# Patient Record
Sex: Male | Born: 1960 | Race: White | Hispanic: No | Marital: Single | Smoking: Former smoker
Health system: Southern US, Community
[De-identification: ages and names within clinical notes are randomized; demographics above are authoritative.]

## PROBLEM LIST (undated history)

## (undated) DIAGNOSIS — T7840XA Allergy, unspecified, initial encounter: Secondary | ICD-10-CM

## (undated) DIAGNOSIS — Z8669 Personal history of other diseases of the nervous system and sense organs: Secondary | ICD-10-CM

## (undated) DIAGNOSIS — R51 Headache: Secondary | ICD-10-CM

## (undated) DIAGNOSIS — R569 Unspecified convulsions: Secondary | ICD-10-CM

## (undated) DIAGNOSIS — I10 Essential (primary) hypertension: Secondary | ICD-10-CM

## (undated) DIAGNOSIS — H5347 Heteronymous bilateral field defects: Secondary | ICD-10-CM

## (undated) DIAGNOSIS — M1990 Unspecified osteoarthritis, unspecified site: Secondary | ICD-10-CM

## (undated) DIAGNOSIS — Q899 Congenital malformation, unspecified: Secondary | ICD-10-CM

## (undated) DIAGNOSIS — I639 Cerebral infarction, unspecified: Secondary | ICD-10-CM

## (undated) DIAGNOSIS — M5412 Radiculopathy, cervical region: Secondary | ICD-10-CM

## (undated) DIAGNOSIS — D649 Anemia, unspecified: Secondary | ICD-10-CM

## (undated) DIAGNOSIS — I671 Cerebral aneurysm, nonruptured: Secondary | ICD-10-CM

## (undated) DIAGNOSIS — H269 Unspecified cataract: Secondary | ICD-10-CM

## (undated) HISTORY — DX: Headache: R51

## (undated) HISTORY — DX: Unspecified cataract: H26.9

## (undated) HISTORY — DX: Allergy, unspecified, initial encounter: T78.40XA

## (undated) HISTORY — DX: Anemia, unspecified: D64.9

## (undated) HISTORY — DX: Heteronymous bilateral field defects: H53.47

## (undated) HISTORY — PX: BRAIN SURGERY: SHX531

## (undated) HISTORY — PX: NECK SURGERY: SHX720

## (undated) HISTORY — DX: Essential (primary) hypertension: I10

## (undated) HISTORY — DX: Personal history of other diseases of the nervous system and sense organs: Z86.69

## (undated) HISTORY — DX: Unspecified osteoarthritis, unspecified site: M19.90

## (undated) HISTORY — DX: Radiculopathy, cervical region: M54.12

## (undated) HISTORY — DX: Congenital malformation, unspecified: Q89.9

## (undated) HISTORY — DX: Cerebral infarction, unspecified: I63.9

---

## 1987-10-13 HISTORY — PX: OTHER SURGICAL HISTORY: SHX169

## 1988-03-12 HISTORY — PX: CERVICAL DISCECTOMY: SHX98

## 1995-10-13 HISTORY — PX: OTHER SURGICAL HISTORY: SHX169

## 1995-11-13 HISTORY — PX: OTHER SURGICAL HISTORY: SHX169

## 1998-11-27 ENCOUNTER — Encounter: Payer: Self-pay | Admitting: Neurosurgery

## 1998-11-29 ENCOUNTER — Inpatient Hospital Stay (HOSPITAL_COMMUNITY): Admission: RE | Admit: 1998-11-29 | Discharge: 1998-11-30 | Payer: Self-pay | Admitting: Neurosurgery

## 1999-10-01 ENCOUNTER — Encounter: Payer: Self-pay | Admitting: *Deleted

## 1999-10-01 ENCOUNTER — Encounter: Admission: RE | Admit: 1999-10-01 | Discharge: 1999-10-01 | Payer: Self-pay | Admitting: *Deleted

## 1999-10-03 ENCOUNTER — Encounter: Payer: Self-pay | Admitting: Neurological Surgery

## 1999-10-03 ENCOUNTER — Ambulatory Visit (HOSPITAL_COMMUNITY): Admission: RE | Admit: 1999-10-03 | Discharge: 1999-10-03 | Payer: Self-pay | Admitting: Neurological Surgery

## 1999-10-04 ENCOUNTER — Encounter: Payer: Self-pay | Admitting: Neurological Surgery

## 1999-10-04 ENCOUNTER — Observation Stay (HOSPITAL_COMMUNITY): Admission: RE | Admit: 1999-10-04 | Discharge: 1999-10-04 | Payer: Self-pay | Admitting: Neurological Surgery

## 1999-10-15 ENCOUNTER — Encounter: Admission: RE | Admit: 1999-10-15 | Discharge: 1999-10-15 | Payer: Self-pay | Admitting: Neurological Surgery

## 1999-10-15 ENCOUNTER — Encounter: Payer: Self-pay | Admitting: Neurological Surgery

## 1999-10-31 ENCOUNTER — Encounter: Payer: Self-pay | Admitting: Neurological Surgery

## 1999-10-31 ENCOUNTER — Encounter: Admission: RE | Admit: 1999-10-31 | Discharge: 1999-10-31 | Payer: Self-pay | Admitting: Neurological Surgery

## 1999-12-10 ENCOUNTER — Encounter: Payer: Self-pay | Admitting: Neurological Surgery

## 1999-12-10 ENCOUNTER — Encounter: Admission: RE | Admit: 1999-12-10 | Discharge: 1999-12-10 | Payer: Self-pay | Admitting: Neurological Surgery

## 2000-07-10 ENCOUNTER — Emergency Department (HOSPITAL_COMMUNITY): Admission: EM | Admit: 2000-07-10 | Discharge: 2000-07-10 | Payer: Self-pay

## 2001-03-08 ENCOUNTER — Encounter: Admission: RE | Admit: 2001-03-08 | Discharge: 2001-03-08 | Payer: Self-pay | Admitting: Neurosurgery

## 2001-03-08 ENCOUNTER — Encounter: Payer: Self-pay | Admitting: Neurosurgery

## 2002-02-21 ENCOUNTER — Encounter: Payer: Self-pay | Admitting: Neurosurgery

## 2002-02-21 ENCOUNTER — Ambulatory Visit (HOSPITAL_COMMUNITY): Admission: RE | Admit: 2002-02-21 | Discharge: 2002-02-21 | Payer: Self-pay | Admitting: Neurosurgery

## 2002-04-04 ENCOUNTER — Inpatient Hospital Stay (HOSPITAL_COMMUNITY): Admission: RE | Admit: 2002-04-04 | Discharge: 2002-04-05 | Payer: Self-pay | Admitting: Neurosurgery

## 2002-04-04 ENCOUNTER — Encounter: Payer: Self-pay | Admitting: Neurosurgery

## 2004-11-12 ENCOUNTER — Ambulatory Visit (HOSPITAL_COMMUNITY): Admission: RE | Admit: 2004-11-12 | Discharge: 2004-11-12 | Payer: Self-pay | Admitting: Family Medicine

## 2005-02-25 ENCOUNTER — Ambulatory Visit (HOSPITAL_COMMUNITY): Admission: RE | Admit: 2005-02-25 | Discharge: 2005-02-25 | Payer: Self-pay | Admitting: Neurology

## 2005-04-08 ENCOUNTER — Encounter: Admission: RE | Admit: 2005-04-08 | Discharge: 2005-04-08 | Payer: Self-pay | Admitting: Neurology

## 2005-05-28 ENCOUNTER — Ambulatory Visit (HOSPITAL_COMMUNITY): Admission: RE | Admit: 2005-05-28 | Discharge: 2005-05-28 | Payer: Self-pay | Admitting: Neurosurgery

## 2006-01-31 IMAGING — CT CT HEAD W/O CM
1 of 2 series · 13 of 30 positions shown, 17 images · non-contrast
Comparison: 03/08/01.

CLINICAL DATA: Sharp pain in head, AVM repair 1887, aneurysm clip. 
 CT HEAD WITHOUT CONTRAST ([DATE] HOURS):

[Series 2: brain · axial · 0.47mm/px · z∈[+158,+279]mm · 13 of 28 slices shown, 17 images]
[im 2/28  brain]
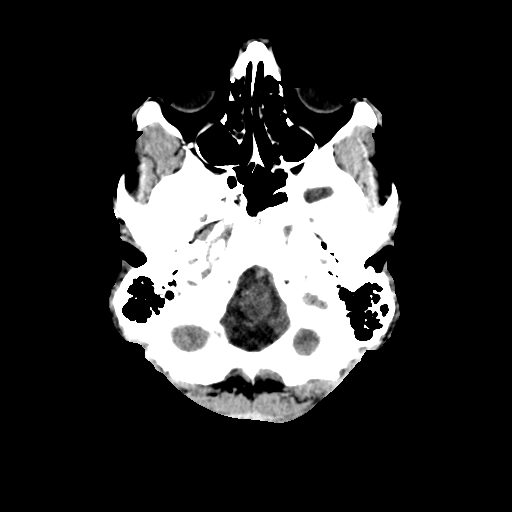
[im 2/28  bone]
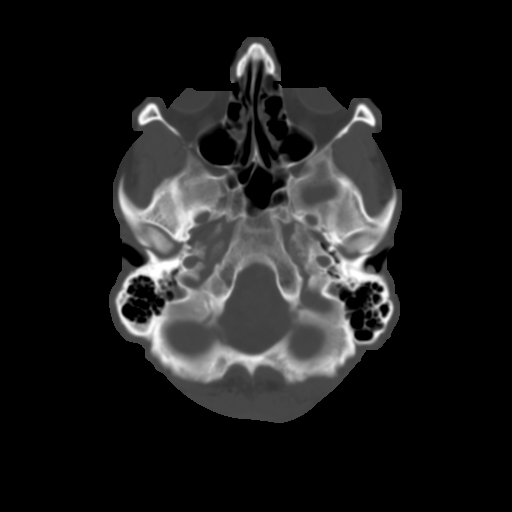
[im 4/28  brain]
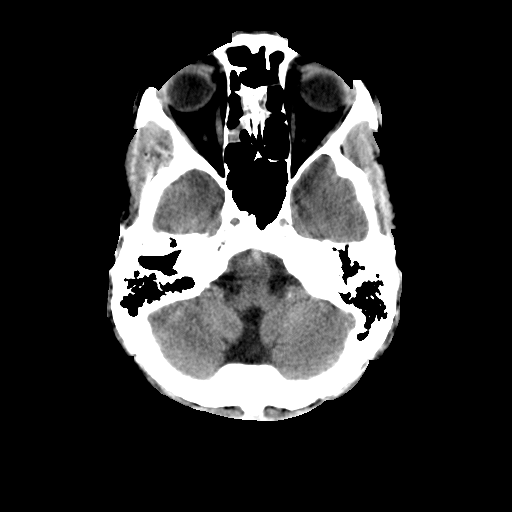
[im 6/28  brain]
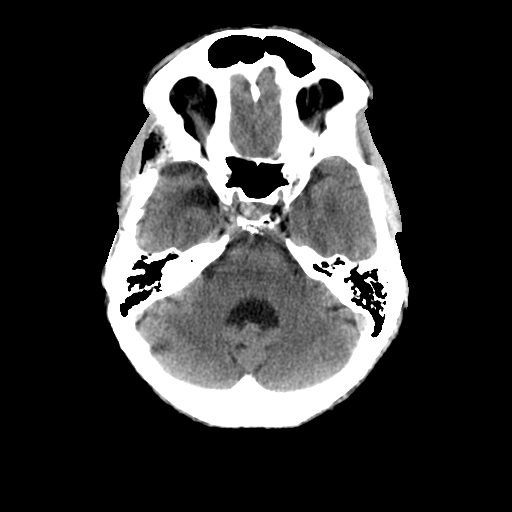
[im 8/28  brain]
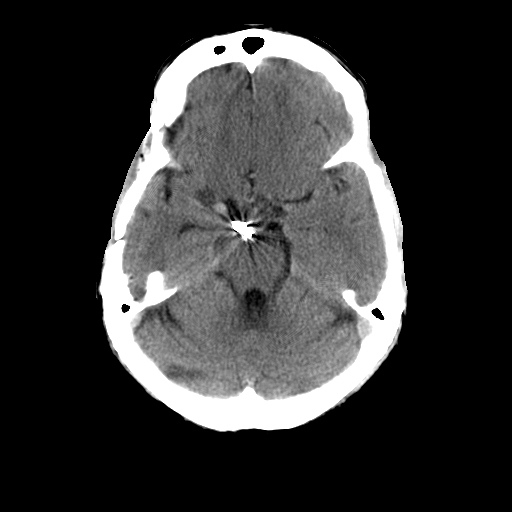
[im 10/28  brain]
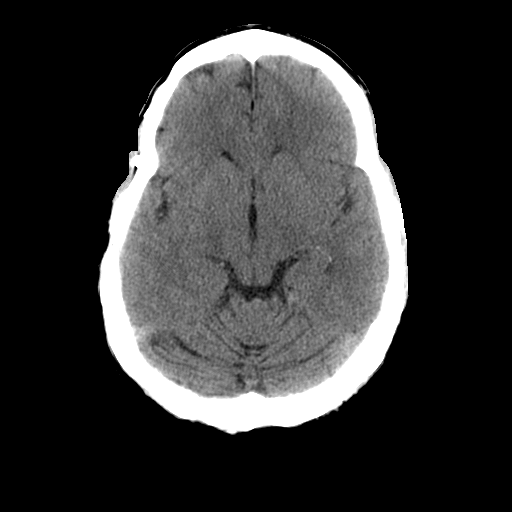
[im 10/28  bone]
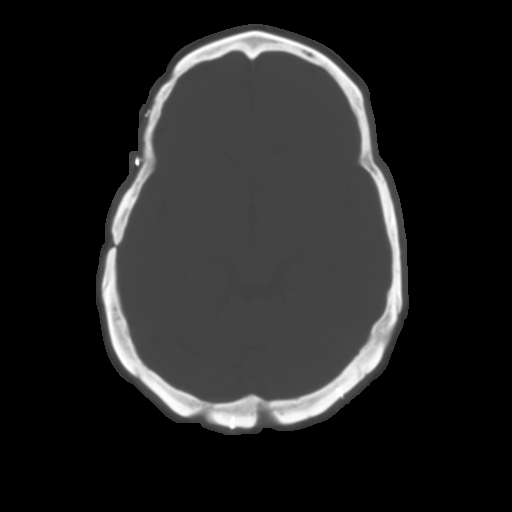
[im 12/28  brain]
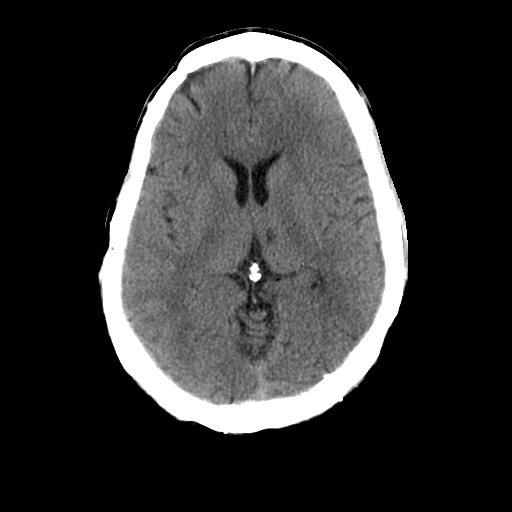
[im 14/28  brain]
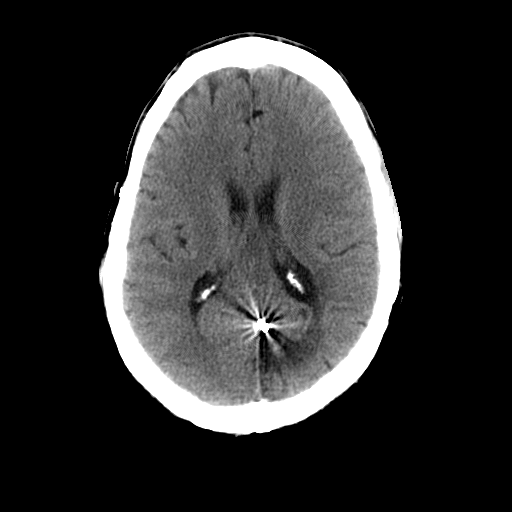
[im 16/28  brain]
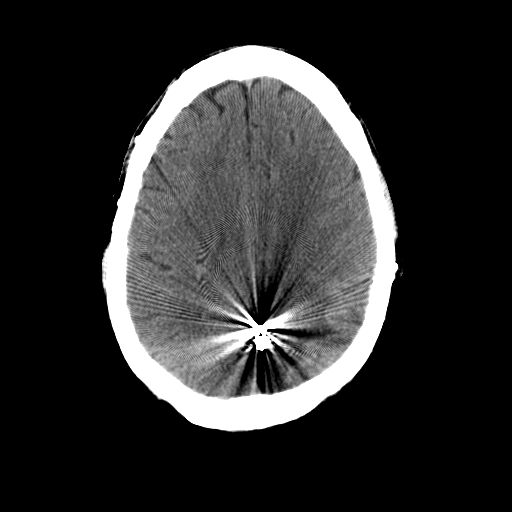
[im 18/28  brain]
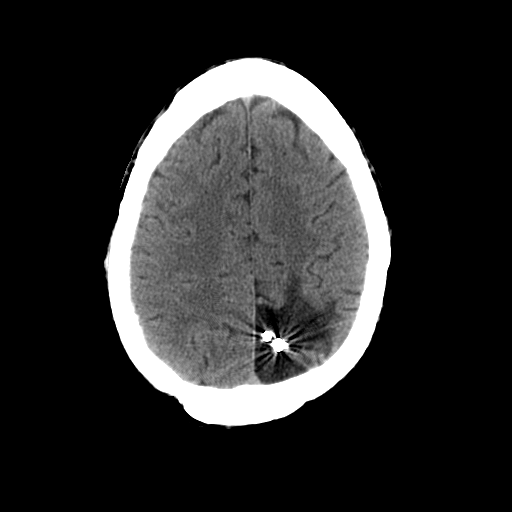
[im 18/28  bone]
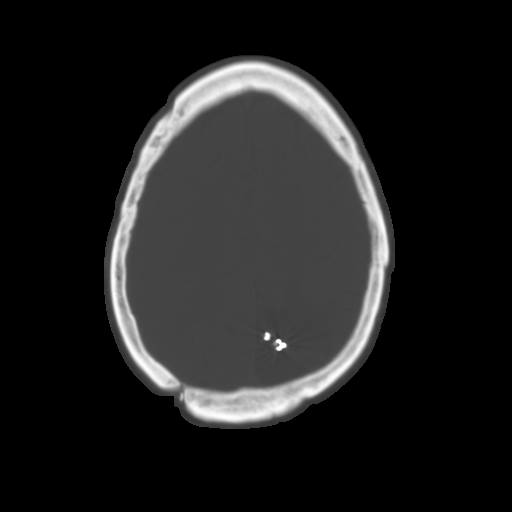
[im 20/28  brain]
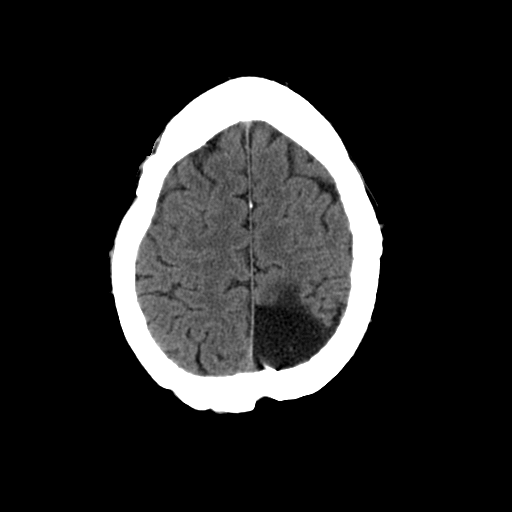
[im 22/28  brain]
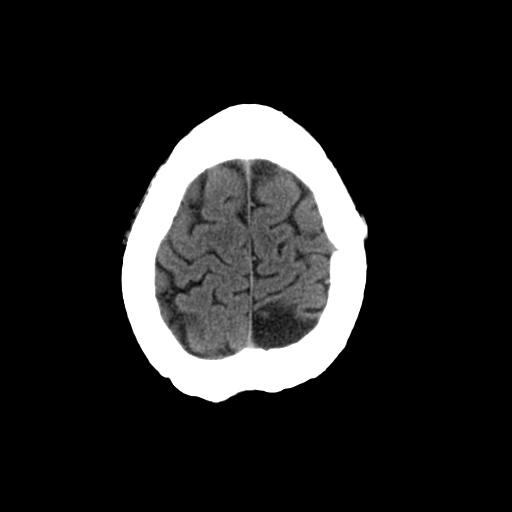
[im 24/28  brain]
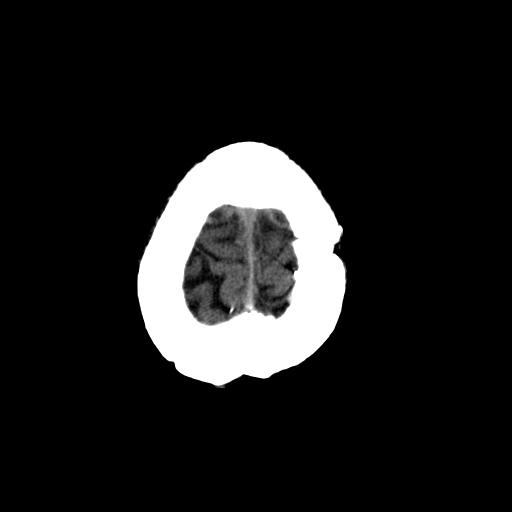
[im 26/28  brain]
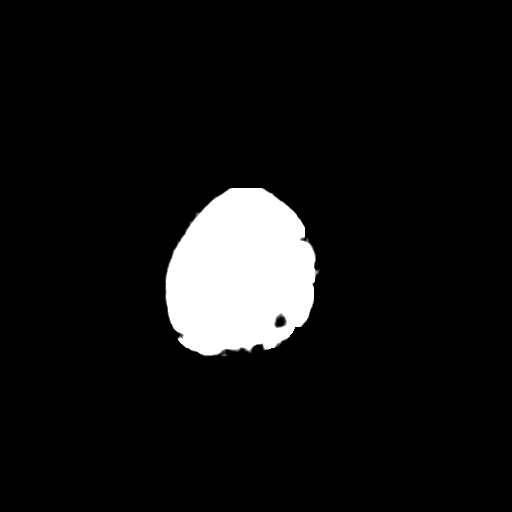
[im 26/28  bone]
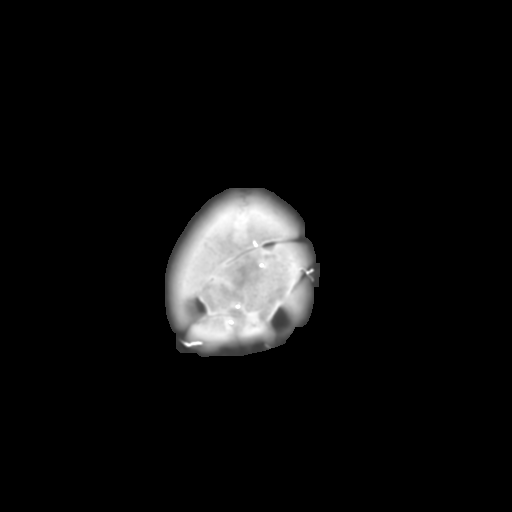

[13 of 30 positions shown; findings below may reference images not displayed]

FINDINGS: An aneurysm clip adjacent to the right posterior clinoid is unchanged in position.  Vascular coils are present in the left parietal region and not significantly changed.  Craniotomy defects are present in the right frontotemporal region and at the vertex.  Encephalomalacia in the left parietooccipital region is again present without change.  Focal encephalomalacia in the left thalamus is stable.  There is no evidence of mass effect, midline shift, or acute hemorrhage.  Fluid within the right ethmoid air cells is noted.
IMPRESSION: No acute intracranial pathology.  Postsurgical changes.

## 2010-01-10 ENCOUNTER — Encounter: Admission: RE | Admit: 2010-01-10 | Discharge: 2010-01-10 | Payer: Self-pay | Admitting: Neurology

## 2010-11-02 ENCOUNTER — Encounter: Payer: Self-pay | Admitting: Neurology

## 2011-02-27 NOTE — H&P (Signed)
[facility]  Patient:    Eric Craig, Eric Craig Visit Number: [id number] MRN: [id number]          Service Type: SUR Location: 3000 [id number] Attending Physician:  Danella Penton Dictated by:   Tanya Nones. Jeral Fruit, M.D. Admit Date:  04/04/2002                           History and Physical  HISTORY OF PRESENT ILLNESS: The patient is a gentleman who is being admitted because of hypertrophy of the left upper extremity.  This gentleman underwent several cervical procedures as well as brain surgery.  He was seen in my office a month ago complaining of some weakness of the left upper extremity. Because of the amount of weakness he had decided to have a cervical myelogram. The reason was because he has several clips, including one in the basilar artery.  He denies any problem with the right upper extremity.  PAST MEDICAL HISTORY:  1. 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  2. Also surgery for anterior cervical diskectomy at the level of 4-5 and     also 5-6.  MEDICATIONS: Dilantin for some time.  FAMILY HISTORY: Unremarkable.  PHYSICAL EXAMINATION:  HEENT: There is scar from previous surgery.  NECK: Anterior and posterior scar.  He is able to flex and extend with no problem.  LUNGS: Clear.  CARDIAC: Heart sounds normal.  ABDOMEN: Normal.  EXTREMITIES: Normal pulses.  NEUROLOGIC: Mental status normal.  Cranial nerves normal.  Strength 5/5 except in the left upper extremity.  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  He also has hypotonia of the left pectoralis major. Reflexes absent in the left biceps and triceps on the left side.  Sensation normal.  Coordination normal.  LABORATORY DATA: Cervical myelogram showed that he has a large spur at the level of 5-6 compromising the C6 nerve root; 4-5  and 5-6 and 6-7 are completely normal.  CLINICAL IMPRESSION: Left C6 radiculopathy, chronic, secondary to osteophyte.  PLAN: The patient is being admitted for surgery ______.  He knows about the risks such as infection, CSF leak, worsening of pain, paralysis, need for further surgery, no improvement whatsoever, or emboli. Dictated by:   Tanya Nones. Jeral Fruit, M.D. Attending Physician:  Danella Penton DD:  04/04/02 TD:  04/05/02 Job: [phone]

## 2011-02-27 NOTE — Op Note (Signed)
[facility]  Patient:    Eric Craig, Eric Craig Visit Number: [id number] MRN: [id number]          Service Type: SUR Location: 3000 [id number] Attending Physician:  Danella Penton Dictated by:   Tanya Nones. Jeral Fruit, M.D. Proc. 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  OPERATION PERFORMED:  Left C5-6 foraminotomy using the C-arm of the Matrix system.  SURGEON:  Tanya Nones. Jeral Fruit, M.D.  ANESTHESIA:  INDICATIONS FOR PROCEDURE:  The patient is a 50 year old gentleman who underwent multiple brain surgery as well as spinal surgery.  I saw him in my office several weeks ago with atrophy of the left upper extremity.  He had weakness of the biceps and the left wrist extensor and the triceps.  Myelogram showed that he has stenosis at L5-6 secondary to a large spondylosis.  Surgery was advised.  DESCRIPTION OF PROCEDURE:  The patient was taken to the OR and he was positioned up in a sitting manner using three pins.  Then the neck was prepped with Betadine.  Then using the C-arm with a pin, we were able to localize the area between 5-6 and the left side.  An incision less than an inch was made on the midline through the skin down to the muscle and to the fascia.  Then dilator were inserted.  This was done using the C-arm.  At the end we were able to localize the area of 5-6.  The final dilator was positioned in place and tied with the retractor system.  Then we brought the microscope.  The muscle was retracted and it was cut with the Bovie.  We identified 5-6.   With the drill, we removed the lower lamina of C5 and the upper of C6.  Because we knew that it was fusioned _______, we went laterally and removed the facet. We found that the nerve was full of scar and was tight and quite red.  There was an area that was yellowish.  Using the 1 and 2 mm Kerrison punch, we were able to accomplish complete foraminotomy with plenty of room for the C6 nerve root. Final opinion, the ____________ bone below was negative and there was no evidence of any soft disk.  Having done this the area was irrigated. The wound was closed with Vicryl and Steri-Strip.  The patient will go back again to his private room. Dictated by:   Tanya Nones. Jeral Fruit, M.D. Attending Physician:  Danella Penton DD:  04/04/02 TD:  04/05/02 Job: [id number]

## 2011-10-13 DIAGNOSIS — I639 Cerebral infarction, unspecified: Secondary | ICD-10-CM

## 2011-10-13 HISTORY — DX: Cerebral infarction, unspecified: I63.9

## 2011-11-13 ENCOUNTER — Emergency Department (HOSPITAL_COMMUNITY): Payer: 59

## 2011-11-13 ENCOUNTER — Inpatient Hospital Stay (HOSPITAL_COMMUNITY)
Admission: EM | Admit: 2011-11-13 | Discharge: 2011-11-16 | DRG: 066 | Disposition: A | Discharge status: Home Health | Payer: 59 | Attending: Internal Medicine | Admitting: Internal Medicine

## 2011-11-13 ENCOUNTER — Other Ambulatory Visit: Payer: Self-pay

## 2011-11-13 ENCOUNTER — Encounter (HOSPITAL_COMMUNITY): Payer: Self-pay | Admitting: Emergency Medicine

## 2011-11-13 DIAGNOSIS — G40909 Epilepsy, unspecified, not intractable, without status epilepticus: Secondary | ICD-10-CM | POA: Diagnosis present

## 2011-11-13 DIAGNOSIS — Z8673 Personal history of transient ischemic attack (TIA), and cerebral infarction without residual deficits: Secondary | ICD-10-CM | POA: Diagnosis present

## 2011-11-13 DIAGNOSIS — I6350 Cerebral infarction due to unspecified occlusion or stenosis of unspecified cerebral artery: Principal | ICD-10-CM | POA: Diagnosis present

## 2011-11-13 DIAGNOSIS — I10 Essential (primary) hypertension: Secondary | ICD-10-CM | POA: Diagnosis present

## 2011-11-13 DIAGNOSIS — I639 Cerebral infarction, unspecified: Secondary | ICD-10-CM

## 2011-11-13 DIAGNOSIS — R1312 Dysphagia, oropharyngeal phase: Secondary | ICD-10-CM | POA: Diagnosis present

## 2011-11-13 DIAGNOSIS — J449 Chronic obstructive pulmonary disease, unspecified: Secondary | ICD-10-CM | POA: Diagnosis present

## 2011-11-13 DIAGNOSIS — R29898 Other symptoms and signs involving the musculoskeletal system: Secondary | ICD-10-CM | POA: Diagnosis present

## 2011-11-13 DIAGNOSIS — I517 Cardiomegaly: Secondary | ICD-10-CM

## 2011-11-13 DIAGNOSIS — I498 Other specified cardiac arrhythmias: Secondary | ICD-10-CM | POA: Diagnosis present

## 2011-11-13 DIAGNOSIS — J4489 Other specified chronic obstructive pulmonary disease: Secondary | ICD-10-CM | POA: Diagnosis present

## 2011-11-13 DIAGNOSIS — F17200 Nicotine dependence, unspecified, uncomplicated: Secondary | ICD-10-CM | POA: Diagnosis present

## 2011-11-13 HISTORY — DX: Cerebral aneurysm, nonruptured: I67.1

## 2011-11-13 HISTORY — DX: Unspecified convulsions: R56.9

## 2011-11-13 LAB — COMPREHENSIVE METABOLIC PANEL
AST: 15 U/L (ref 0–37)
Albumin: 3.5 g/dL (ref 3.5–5.2)
Alkaline Phosphatase: 107 U/L (ref 39–117)
BUN: 13 mg/dL (ref 6–23)
CO2: 24 mEq/L (ref 19–32)
GFR calc Af Amer: >90 mL/min (ref >90)
GFR calc non Af Amer: >90 mL/min (ref >90)
Potassium: 3.5 mEq/L (ref 3.5–5.1)
Sodium: 141 mEq/L (ref 135–145)
Total Protein: 6.7 g/dL (ref 6.0–8.3)

## 2011-11-13 LAB — URINALYSIS, ROUTINE W REFLEX MICROSCOPIC
Bilirubin Urine: NEGATIVE
Hgb urine dipstick: NEGATIVE
Ketones, ur: NEGATIVE mg/dL
Leukocytes, UA: NEGATIVE
Nitrite: NEGATIVE
Specific Gravity, Urine: 1.026 (ref 1.005–1.030)

## 2011-11-13 LAB — GLUCOSE, CAPILLARY: Glucose-Capillary: 104 mg/dL — ABNORMAL HIGH (ref 70–99)

## 2011-11-13 LAB — PROTIME-INR: Prothrombin Time: 13.2 seconds (ref 11.6–15.2)

## 2011-11-13 LAB — DIFFERENTIAL
Basophils Absolute: 0 10*3/uL (ref 0.0–0.1)
Eosinophils Absolute: 0.1 10*3/uL (ref 0.0–0.7)
Lymphocytes Relative: 28 % (ref 12–46)
Lymphs Abs: 1.1 10*3/uL (ref 0.7–4.0)
Monocytes Absolute: 0.3 10*3/uL (ref 0.1–1.0)

## 2011-11-13 LAB — PHENYTOIN LEVEL, TOTAL: Phenytoin Lvl: 5 ug/mL — ABNORMAL LOW (ref 10.0–20.0)

## 2011-11-13 LAB — APTT: aPTT: 30 seconds (ref 24–37)

## 2011-11-13 LAB — CBC
HCT: 41.2 % (ref 39.0–52.0)
MCH: 34.3 pg — ABNORMAL HIGH (ref 26.0–34.0)
Platelets: 175 10*3/uL (ref 150–400)

## 2011-11-13 MED ORDER — CARBAMAZEPINE ER 100 MG PO CP12: 200.0000 mg | ORAL_CAPSULE | Freq: Two times a day (BID) | ORAL | Status: DC

## 2011-11-13 MED ORDER — ONDANSETRON HCL 4 MG PO TABS
4.0000 mg | ORAL_TABLET | Freq: Four times a day (QID) | ORAL | Status: DC | PRN
  Administered 2011-11-13: 4 mg via ORAL
  Filled 2011-11-13: qty 1

## 2011-11-13 MED ORDER — IBUPROFEN 600 MG PO TABS
600.0000 mg | ORAL_TABLET | Freq: Once | ORAL | Status: AC
  Administered 2011-11-14: 600 mg via ORAL
  Filled 2011-11-13 (×2): qty 1

## 2011-11-13 MED ORDER — ONDANSETRON HCL 4 MG/2ML IJ SOLN: 4.0000 mg | Freq: Four times a day (QID) | INTRAMUSCULAR | Status: DC | PRN

## 2011-11-13 MED ORDER — SODIUM CHLORIDE 0.9 % IJ SOLN
3.0000 mL | Freq: Two times a day (BID) | INTRAMUSCULAR | Status: DC
  Administered 2011-11-14 – 2011-11-16 (×5): 3 mL via INTRAVENOUS

## 2011-11-13 MED ORDER — ENOXAPARIN SODIUM 40 MG/0.4ML ~~LOC~~ SOLN
40.0000 mg | SUBCUTANEOUS | Status: DC
  Administered 2011-11-13 – 2011-11-15 (×3): 40 mg via SUBCUTANEOUS
  Filled 2011-11-13 (×4): qty 0.4

## 2011-11-13 MED ORDER — ONDANSETRON HCL 4 MG/2ML IJ SOLN
4.0000 mg | Freq: Once | INTRAMUSCULAR | Status: AC
  Administered 2011-11-13: 4 mg via INTRAVENOUS

## 2011-11-13 MED ORDER — ASPIRIN 300 MG RE SUPP
300.0000 mg | Freq: Every day | RECTAL | Status: DC
  Filled 2011-11-13: qty 1

## 2011-11-13 MED ORDER — CARBAMAZEPINE ER 200 MG PO TB12
200.0000 mg | ORAL_TABLET | Freq: Every day | ORAL | Status: DC
  Administered 2011-11-14 – 2011-11-16 (×3): 200 mg via ORAL
  Filled 2011-11-13 (×4): qty 1

## 2011-11-13 MED ORDER — SODIUM CHLORIDE 0.9 % IV SOLN: 250.0000 mL | INTRAVENOUS | Status: DC | PRN

## 2011-11-13 MED ORDER — SODIUM CHLORIDE 0.9 % IJ SOLN: 3.0000 mL | INTRAMUSCULAR | Status: DC | PRN

## 2011-11-13 MED ORDER — ONDANSETRON HCL 4 MG/2ML IJ SOLN
INTRAMUSCULAR | Status: AC
  Filled 2011-11-13: qty 2

## 2011-11-13 MED ORDER — PHENYTOIN SODIUM EXTENDED 100 MG PO CAPS: 100.0000 mg | ORAL_CAPSULE | Freq: Two times a day (BID) | ORAL | Status: DC

## 2011-11-13 MED ORDER — PHENYTOIN SODIUM EXTENDED 100 MG PO CAPS
200.0000 mg | ORAL_CAPSULE | Freq: Every day | ORAL | Status: DC
  Administered 2011-11-14 – 2011-11-16 (×3): 200 mg via ORAL
  Filled 2011-11-13 (×4): qty 2

## 2011-11-13 MED ORDER — PHENYTOIN SODIUM EXTENDED 100 MG PO CAPS
100.0000 mg | ORAL_CAPSULE | Freq: Every day | ORAL | Status: DC
  Administered 2011-11-13 – 2011-11-15 (×3): 100 mg via ORAL
  Filled 2011-11-13 (×4): qty 1

## 2011-11-13 MED ORDER — CARBAMAZEPINE ER 200 MG PO TB12
300.0000 mg | ORAL_TABLET | Freq: Every day | ORAL | Status: DC
  Administered 2011-11-13 – 2011-11-15 (×3): 300 mg via ORAL
  Filled 2011-11-13 (×4): qty 1

## 2011-11-13 NOTE — ED Notes (Signed)
PT. WOKE UP THIS MORNING WITH DIZZINESS , UNSTEADY GAIT AND LEFT LIP NUMBNESS , ALERT AND ORIENTED AT ARRIVAL , SPEECH CLEAR , NO FACIAL ASYMMETRY , EQUAL STRONG GRIPS AND NO ARM DRIFT.

## 2011-11-13 NOTE — ED Notes (Signed)
Pt states that his left side of his face feels numb at this time.

## 2011-11-13 NOTE — Progress Notes (Signed)
VASCULAR LAB PRELIMINARY    No significant ICA stenosis bilaterally.  Right vertebral flow is antegrade. Left vertebral flow is atypical (A To and Fro waveform).   Eric Craig, 11/13/2011, 4:08 PM

## 2011-11-13 NOTE — ED Notes (Signed)
Patient transported to CT 

## 2011-11-13 NOTE — ED Provider Notes (Signed)
History     CSN: [id number]  Arrival date & time 11/13/11  3086   First MD Initiated Contact with Patient 11/13/11 0732      Chief Complaint  Patient presents with  . Dizziness    (Consider location/radiation/quality/duration/timing/severity/associated sxs/prior treatment) HPI Comments: Patient is a 51 year old man who presents for definitive he feels that last night he went to bed and developed a cold sweat and briefly couldn't sleep. This morning he had trouble standing up left facial numbness and dizziness such that he was unsteady and seemed to fall to the left. He's had prior episodes of dizziness but never like this. 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 He says for subsequent cervical disc surgeries.  Patient is a 51 y.o. male presenting with neurologic complaint. The history is provided by the patient, the spouse and medical records. No language interpreter was used.  Neurologic Problem The primary symptoms include headaches, dizziness, visual change, focal weakness and nausea. Primary symptoms do not include fever. The symptoms began 6 to 12 hours ago. Episode duration: Currently feels dizzy if he changes position, but is all right if he is resting quietly. The symptoms are unchanged. The neurological symptoms are multifocal.  The headache began yesterday. The headache developed suddenly. Headache is a new problem. The headache is present rarely. Location/region(s) of the headache: bilateral. Associated with: No precipitating event. The headache is associated with visual change.  Description: Unsteadiness, falling to left. The dizziness has been unchanged since its onset. It is a new problem. Associated with: No precipitating event. Dizziness also occurs with blurred vision and nausea.  The visual change began yesterday. The visual change has been resolved since its onset.The visual change includes blurred vision. Loss  of vision: vague sense of loss of vision.    Past Medical History  Diagnosis Date  . Aneurysm of anterior cerebral artery     Past Surgical History  Procedure Date  . Neck surgery   . Brain surgery     No family history on file.  History  Substance Use Topics  . Smoking status: Current Everyday Smoker  . Smokeless tobacco: Not on file  . Alcohol Use: No      Review of Systems  Constitutional: Negative.  Negative for fever.  Eyes: Positive for blurred vision.  Respiratory: Negative.   Cardiovascular: Negative.   Gastrointestinal: Positive for nausea.       Dry heaves.  Genitourinary: Negative.   Musculoskeletal: Negative.   Skin: Negative.   Neurological: Positive for dizziness, focal weakness and headaches.  Psychiatric/Behavioral: Negative.     Allergies  Review of patient's allergies indicates no known allergies.  Home Medications   Current Outpatient Rx  Name Route Sig Dispense Refill  . CARBAMAZEPINE ER 100 MG PO CP12 Oral Take 200-300 mg by mouth 2 (two) times daily. Take 2 capsules every morning and take 3 capsules every evening    . PHENYTOIN SODIUM EXTENDED 100 MG PO CAPS Oral Take 100-200 mg by mouth 2 (two) times daily. Take 2 capsules every morning and take 1 capsule every evening      BP 171/99  Pulse 83  Temp(Src) 97.9 F (36.6 C) (Oral)  Resp 20  SpO2 98%  Physical Exam  Constitutional: He is oriented to person, place, and time.       Well developed man.  Scar in R temporal reason.   HENT:  Head: Normocephalic  and atraumatic.  Right Ear: External ear normal.  Left Ear: External ear normal.  Mouth/Throat: Oropharynx is clear and moist.  Eyes: Conjunctivae and EOM are normal. Pupils are equal, round, and reactive to light.       No nystagmus.   Neck: Normal range of motion. Neck supple.       No carotid bruit.    Cardiovascular: Normal rate, regular rhythm and normal heart sounds.   Pulmonary/Chest: Effort normal and breath sounds  normal.  Abdominal: Soft. Bowel sounds are normal.  Musculoskeletal: Normal range of motion.  Neurological: He is alert and oriented to person, place, and time.       No sensory or motor deficit.  Gait not tested.  Skin: Skin is warm and dry.  Psychiatric: He has a normal mood and affect. His behavior is normal.    ED Course  Procedures (including critical care time)   Labs Reviewed  PROTIME-INR  APTT  CBC  DIFFERENTIAL  COMPREHENSIVE METABOLIC PANEL   1:61 AM  Date: 11/13/2011  Rate: 61  Rhythm: normal sinus rhythm  QRS Axis: normal  Intervals: normal  ST/T Wave abnormalities: normal  Conduction Disutrbances:none  Narrative Interpretation: Normal eKG  Old EKG Reviewed: unchanged  8:01 AM Pt seen --> physical exam performed.  Call to Dr. Alfredo Batty to see if MRI would be safe, where he has had basilar artery clipping.  Lab workup ordered.   9:19 AM Results for orders placed during the hospital encounter of 11/13/11  PROTIME-INR      Component Value Range   Prothrombin Time 13.2  11.6 - 15.2 (seconds)   INR 0.98  0.00 - 1.49   APTT      Component Value Range   aPTT 30  24 - 37 (seconds)  CBC      Component Value Range   WBC 3.9 (*) 4.0 - 10.5 (K/uL)   RBC 4.26  4.22 - 5.81 (MIL/uL)   Hemoglobin 14.6  13.0 - 17.0 (g/dL)   HCT 09.6  04.5 - 40.9 (%)   MCV 96.7  78.0 - 100.0 (fL)   MCH 34.3 (*) 26.0 - 34.0 (pg)   MCHC 35.4  30.0 - 36.0 (g/dL)   RDW 81.1  91.4 - 78.2 (%)   Platelets 175  150 - 400 (K/uL)  DIFFERENTIAL      Component Value Range   Neutrophils Relative 61  43 - 77 (%)   Neutro Abs 2.4  1.7 - 7.7 (K/uL)   Lymphocytes Relative 28  12 - 46 (%)   Lymphs Abs 1.1  0.7 - 4.0 (K/uL)   Monocytes Relative 9  3 - 12 (%)   Monocytes Absolute 0.3  0.1 - 1.0 (K/uL)   Eosinophils Relative 2  0 - 5 (%)   Eosinophils Absolute 0.1  0.0 - 0.7 (K/uL)   Basophils Relative 0  0 - 1 (%)   Basophils Absolute 0.0  0.0 - 0.1 (K/uL)  COMPREHENSIVE METABOLIC PANEL       Component Value Range   Sodium 141  135 - 145 (mEq/L)   Potassium 3.5  3.5 - 5.1 (mEq/L)   Chloride 106  96 - 112 (mEq/L)   CO2 24  19 - 32 (mEq/L)   Glucose, Bld 91  70 - 99 (mg/dL)   BUN 13  6 - 23 (mg/dL)   Creatinine, Ser 9.56  0.50 - 1.35 (mg/dL)   Calcium 8.8  8.4 - 21.3 (mg/dL)   Total Protein 6.7  6.0 -  8.3 (g/dL)   Albumin 3.5  3.5 - 5.2 (g/dL)   AST 15  0 - 37 (U/L)   ALT 15  0 - 53 (U/L)   Alkaline Phosphatase 107  39 - 117 (U/L)   Total Bilirubin 0.4  0.3 - 1.2 (mg/dL)   GFR calc non Af Amer >90  >90 (mL/min)   GFR calc Af Amer >90  >90 (mL/min)  GLUCOSE, CAPILLARY      Component Value Range   Glucose-Capillary 104 (*) 70 - 99 (mg/dL)  PHENYTOIN LEVEL, TOTAL      Component Value Range   Phenytoin Lvl 5.0 (*) 10.0 - 20.0 (ug/mL)  CARBAMAZEPINE LEVEL, TOTAL      Component Value Range   Carbamazepine Lvl 3.7 (*) 4.0 - 12.0 (ug/mL)   Ct Head Wo Contrast  11/13/2011  *RADIOLOGY REPORT*  Clinical Data: Severe dizziness, left-sided facial numbness, and slurred speech.  Remote history of avium resection.  CT HEAD WITHOUT CONTRAST  Technique:  Contiguous axial images were obtained from the base of the skull through the vertex without contrast.  Comparison: CTA head 01/10/2010.  Findings: An aneurysm clip at the basilar tip is stable.  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  There is a remote infarct of the left thalamus.  Minimal white matter disease is evident.  No acute infarct, hemorrhage, or mass lesion is present.  Remote encephalomalacia of the left parietal and occipital lobe is stable.  The patient is status post left parietal craniotomy and right temporal craniotomy as before.  The paranasal sinuses and mastoid air cells are clear.  IMPRESSION:  1.  No acute intracranial abnormality or significant interval change. 2.  Stable left parietal and occipital encephalomalacia following  resection of an arteriovenous malformation. 3.  Stable appearance of multiple aneurysm clips. 4.  Remote lacunar infarct of the left thalamus.  Original Report Authenticated By: Jamesetta Orleans. MATTERN, M.D.   Dg Chest Portable 1 View  11/13/2011  *RADIOLOGY REPORT*  Clinical Data: Dizziness and vomiting.  PORTABLE CHEST - 1 VIEW  Comparison: No comparison studies available.  Findings: No focal airspace consolidation or pulmonary edema. There is some atelectasis at the left base. The cardiopericardial silhouette is within normal limits for size. Imaged bony structures of the thorax are intact. Telemetry leads overlie the chest.  IMPRESSION: Minimal left base atelectasis.  Original Report Authenticated By: ERIC A. MANSELL, M.D.    Lab tests normal.  CT of head shows encephalomalacia, clips from rior surgery, no acute infarct or hemorrhage or mass.  Results reviewed with pt and family.  Call to Neurology --> 9:30 AM Neurology will see pt in consult.  11:15 AM Stroke team advises they are concerned pt had a posterior circulation stroke.  Advised Dr. Evlyn Kanner of this; he was covering Dr. Jacky Kindle. Dr. Evlyn Kanner requested that the stroke team admit the pt.    12:59 PM Case discussed with [facility], who will admit pt, with Dr. Ilsa Iha attending, to 3000.  1. CVA (cerebral infarction)            Carleene Cooper III, MD 11/13/11 1302

## 2011-11-13 NOTE — ED Notes (Signed)
MD at bedside. 

## 2011-11-13 NOTE — ED Notes (Signed)
Bed assigned and report called.  No admit orders received at this time.   Pt in cardiovascular dept having procedure.  Will call Riley Lam, RN on floor when pt ready to transfer to floor.

## 2011-11-13 NOTE — H&P (Signed)
Hospital Admission Note Date: 11/13/2011  Patient name: Eric Craig Medical record number: [id number] Date of birth: [birth year] Age: 51 y.o. Gender: male PCP: Eric Meo, MD, MD  Medical Service: Internal Medicine  Attending physician:     1st Contact: Dr. Judyann Munson  Pager: 2nd Contact:     Pager: After 5 pm or weekends: 1st Contact:  Wylene Men, MS4 Pager: [phone] 2nd Contact:  Odetta Pink  Pager: [phone]  Chief Complaint: Dizziness  History of Present Illness: [name] is a 51 year old man with a past medical history of basilar artery aneurysm and seizure who presents with dizziness of 1 day duration. At 9:00pm on the last night the patient felt hot flashes and dysphagia. Upon waking in the morning he had difficulty getting out of bed because of a "spinning" sensation. When he tried to walk he would fall to the left. Patient has the sensation to fall to the left. He denies that the sensation to fall is due to muscle weakness. Patient complains of intermittent dizziness over the past month, however the falling sensation and dysphagia are new. Patient complains of a ringing in his ears over the past several weeks, numbness along his left jaw that feels like "being numb at the dentist", headache, and non-bloody vomiting. Patient endor Patient endorses headache along with his symptoms. Patient denies any chest pain, palpitations, or shortness of breath. Patient says his vision is "normal" and denies diplopia. Patient denies any recent illnesses, fevers, diarrhea or abdominal discomfort.   Medications Prior to Admission: 1. Aspirin 300mg  suppository once 2. Zofran 4mg  injection once  Outpatient Medications: 1. Carbatrol 200mg  po qAM, 300mg  po qPM 2. Dilantin 200mg  po qAM, 100mg  po qPM 3. Multivitamin 4. Supplemental Calcium   Allergies: Review of patient's allergies indicates no known allergies. Past Medical History  Diagnosis Date  . Aneurysm of anterior  cerebral artery     Basilar artery aneurysm repair in 1997  . Seizures     Last seizure was 2001   Past Surgical History  Procedure Date  . Neck surgery     C4-5 repair in 1989. C6-7 repair in 1993 and 2001. Residual left arm weakness  . Brain surgery     Basilar artery aneurysm repair in 1997, with facial reconstruction in 1998   Family History  Problem Relation Age of Onset  . Aortic aneurysm Mother 37  . Hypertension Father    History   Social History  . Marital Status: Single    Spouse Name: N/A    Number of Children: 2  . Years of Education: N/A   Occupational History  . Electrician [employer]   Social History Main Topics  . Smoking status: Current Everyday Smoker -- 1.0 packs/day for 40 years    Types: Cigarettes  . Smokeless tobacco: Not on file  . Alcohol Use: Yes     Drinks 12 beers per year  . Drug Use: No  . Sexually Active: Not on file   Other Topics Concern  . Not on file   Social History Narrative   Lives at home with his wife (cell phone number [phone]). Has 2 daughters that accompany him here today.     Review of Systems: Constitutional: negative for chills, fatigue, fevers, malaise and night sweats Eyes: negative except for contacts/glasses and visual disturbance Ears, nose, mouth, throat, and face: positive for tinnitus, negative for hearing loss, hoarseness, sore mouth and sore throat Respiratory: negative for cough, pleurisy/chest pain, pneumonia and sputum  Cardiovascular: negative for chest pain, chest pressure/discomfort, dyspnea, fatigue, irregular heart beat, orthopnea, palpitations, syncope and tachypnea Gastrointestinal: negative for abdominal pain and change in bowel habits Genitourinary:negative for dysuria and frequency Hematologic/lymphatic: negative for bleeding and lymphadenopathy Musculoskeletal:positive for muscle weakness, negative for arthralgias, bone pain, myalgias and stiff joints Neurological: positive for  coordination problems, dizziness and headaches, negative for memory problems, paresthesia, seizures, speech problems and tremors Behavioral/Psych: negative for bad mood, depression and excessive alcohol consumption Endocrine: negative for diabetic symptoms including blurry vision, increased fatigue, polydipsia, polyphagia and polyuria  Physical Exam: Blood pressure 154/88, pulse 54, temperature 97.3 F (36.3 C), temperature source Oral, resp. rate 18, height 5\' 11"  (1.803 m), weight 75.8 kg (167 lb 1.7 oz), SpO2 97.00%. Constitutional: Alert and orientated to person, time,and place. Head: Well healed scar on forehead. Normocephalic. Atraumatic Eyes: EOMI. Visual Field deficit in right lower visual fields. Pupils equally round and reactive to light and accomodation. Horizontal nystagmus No cupping of optic disk with normal red reflex.  Neck: No thyroidmegaly. No cervical lymphadenopathy.  Pulmonary: Crackles in lower lung fields. No wheezing or rhonchii Cardiovascular: Bradycardic. Regular rhythm. No murmurs, rubs, or gallops. No JVD. 2+ radial and pedal pulses. Capillary refill < 2s.  Gastrointestinal: Bowel sounds present. Soft. Non-tender. No hepatosplenomegaly.  Extremities: No LE edema. Acyanotic Neuro: CN 2-12 intact. Visual field deficit in right lower visual fields. Normal pinprick and soft-touch to face, arms, hands, and feet bilaterally. 5/5 strength of grip, leg flexion, leg extension, and feet bilaterally. 5/5 strength of right arm flexion and extension. 4/5 strength of left arm flexion and extension. 2+ radial, tricep, and right patellar reflexes. 1+ left patellar reflex. Abnormal finger-nose-finger with left arm. Normal rapid alternating finger movements bilaterally.   Lab results: BMP: 141/3.5/106/24/13/0.88<104; Ca 8.8 CBC: 3.9>14.6/41.2<175 LFTs: Alb 3.5, Alk Phos 107, AST 15, ALT 15, Tbili 0.4  Imaging results:  Ct Head Wo Contrast  11/13/2011  *RADIOLOGY REPORT*  IMPRESSION:   1.  No acute intracranial abnormality or significant interval change. 2.  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 3.  Stable appearance of multiple aneurysm clips. 4.  Remote lacunar infarct of the left thalamus.  Original Report Authenticated By: Jamesetta Orleans. MATTERN, M.D.   Mr Brain Wo Contrast  11/13/2011  *RADIOLOGY REPORT*  IMPRESSION:  1.  No acute intracranial abnormality. 2. Postoperative changes of bilateral craniotomies with multiple aneurysm clips as described. 3.  Stable encephalomalacia in the left occipital and parietal lobe. 4.  Sinus disease.  Original Report Authenticated By: Jamesetta Orleans. MATTERN, M.D.   Dg Chest Portable 1 View  11/13/2011  *RADIOLOGY REPORT*  IMPRESSION: Minimal left base atelectasis.  Original Report Authenticated By: ERIC A. MANSELL, M.D.   MRI Brain/ Non-contrast  11/13/2011 IMPRESSION: No acute intracranial abnormality. Postoperative changes of bilateral craniotomies with multiple aneurysm clips as described. Stable encephalomalacia in the left occipital and parietal lobe. Sinus disease.    Other results: EKG: normal EKG, normal sinus rhythm, unchanged from previous tracings.  Assessment & Plan by Problem:  Mr. Shallenberger is a 51 year old man with a PMH significant for left parietal AVM, basilar artery aneurysm clips, and seizures who presents with symptoms consistent with a cerebellar cerebrovascular accident. He is currently is stable condition.   1. Stroke: Patient's symptoms are consistent with a cerebellar stroke. CT without contrast does not show any evidence of intracranial bleed. Non-contrasted MRI of the head does not show any evidence of ischemia at  this time. However, his physical exam and presentation are consistent with acute cerebellar dysfunction. TTE and carotid dopplars are pending. The patient's EKG does not show any signs of dysrrhythmia. Patient has received aspirin  anticoagulation.  -- Admit with telemetry and q4h neuro checks -- Orthostatic vital signs -- Carotid dopplars pending -- Follow neurology recommendations for further aspirin recommendations.  -- Follow up on swallow study for diet management.  -- A1C and lipid panel are pending.   2. Seizure: The patient has not experienced any seizures. However, the patient is currently subtherapeutic on his seizure medications, likely secondary to vomiting. Outpatient records from Dr. Sandria Manly indicate the patient is typically therapeutic on his outpatient antiepileptic regimen.  -- Carbamazepine 200mg  po qAM and 300mg  po qPM -- Phenytoin 200mg  po qAM and 100mg  po qPM  3. Hypertension: The patient's blood pressure is elevated in the 160s/90s. Given the patient's likely ischemic CVA this is permissible. Neurology consultation has a goal MAP 120-130.  -- Monitor BP  4. Arteriovenous Malformation: Longstanding. Likely responsible for patient's right lower quadrant visual field defects. Stable on CT.  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    6. Prophylaxis: SCDs. Will avoid heparins due to intracranial bleeding risk.     Signed: Wilmer Floor 11/13/2011, 5:23 PM

## 2011-11-13 NOTE — ED Notes (Signed)
Patient states that last night before going to bed he had onset of breaking out in a "cold sweat"  Pt states that this morning when he woke up he has had dizziness and unable to walk.  Wife states that it sounded like patient was falling all over the place this morning when walking around in the bathroom.  Pt also has difficulty speaking.  He states that he feels like it is slightly harder to talk than normal.  Pt also states that he feels like he can't swallow.  Pt has no noted drooling at this time.  Pt grips are equal.  No arm drift.   Pts left eye appears to be slightly droopy compared to right.  Pt has nausea with same.  Pt states that he has increase in nausea with ambulation.  Pt is alert and oriented at this time.

## 2011-11-13 NOTE — Consult Note (Signed)
[facility] CONSULT NOTE     Reason for Consult: dizziness CC: Diziness   HPI:    Eric Craig is an 51 y.o. male PmHx COPD, visual defect, congenital anomaly of cerebrovascular system,, diplopia, HTN, HA, epilepsy who is followed out patient by Dr. Sandria Manly of [facility].  Patient went to sleep last night at 12 and noted at that time to have difficulty swallowing his own secretions and broke out in " a cold sweat" just before going to sleep.  He awoke at 6:15 this am and felt he could not keep his balance.  He kept to listing to the left and wife noted a new left facial droop, left eye ptosis and patient noted left facial numbness.  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   Past Medical History  Diagnosis Date  . Aneurysm of anterior cerebral artery     Past Surgical History  Procedure Date  . Neck surgery   . Brain surgery     History reviewed. No pertinent family history.  Social History:  reports that he has been smoking.  He does not have any smokeless tobacco history on file. He reports that he does not drink alcohol or use illicit drugs.  No Known Allergies  Medications:    Prior to Admission:  Dilantin Carbatrol Scheduled:   . ondansetron      . ondansetron (ZOFRAN) IV  4 mg Intravenous Once    Review of Systems - General ROS: negative for - chills, fatigue, fever or hot flashes Hematological and Lymphatic ROS: negative for - bruising, fatigue, jaundice or pallor Endocrine ROS: negative for - hair pattern changes, hot flashes, mood swings or skin changes Respiratory ROS: negative for - cough, hemoptysis, orthopnea or wheezing Cardiovascular ROS: negative for - dyspnea on exertion, orthopnea, palpitations or shortness of breath Gastrointestinal ROS: negative for - abdominal pain, appetite loss, blood in stools, diarrhea or hematemesis Musculoskeletal ROS: negative for -  joint pain, joint stiffness, joint swelling or muscle pain Neurological ROS: See HPI Dermatological ROS: negative for dry skin, pruritus and rash   Blood pressure 162/85, pulse 55, temperature 97.9 F (36.6 C), temperature source Oral, resp. rate 18, SpO2 99.00%.   Neurologic Examination:   Mental Status: Alert, oriented, thought content appropriate.  Speech dysarthric without evidence of aphasia. Able to follow 3 step commands without difficulty. Cranial Nerves: II-Visual fields decreased in the right lower quadrant (basline) otherwise intact. III/IV/VI-Extraocular movements intact.  Pupils reactive bilaterally. V/VII-Smile asymmetric left side VIII-grossly intact except left lower face has decreased sensation IX/X-normal gag , palate rises equaly XI-bilateral shoulder shrug XII-midline tongue extension Motor: 5/5 bilaterally with normal tone and bulk +) dysmetria with left arm and slight dysmetria with left leg Sensory: Pinprick and light touch intact throughout, bilaterally Deep Tendon Reflexes: 2+ and symmetric throughout UE and depressed through out in lower extremity Plantars: Downgoing left upgoinig right Cerebellar: Finger-to-nose left dysmetric , normal rapid alternating movements and  heel-to-shin test left is dysmetric.   Gait -he tends to fall to the left.   No results found for this basename: cbc, bmp, coags, chol, tri, ldl, hga1c    Results for orders placed during the hospital encounter of 11/13/11 (from the past 48 hour(s))  GLUCOSE, CAPILLARY     Status: Abnormal  Collection Time   11/13/11  7:39 AM      Component Value Range Comment   Glucose-Capillary 104 (*) 70 - 99 (mg/dL)   PROTIME-INR     Status: Normal   Collection Time   11/13/11  7:47 AM      Component Value Range Comment   Prothrombin Time 13.2  11.6 - 15.2 (seconds)    INR 0.98  0.00 - 1.49    APTT     Status: Normal   Collection Time   11/13/11  7:47 AM      Component Value Range Comment   aPTT 30   24 - 37 (seconds)   CBC     Status: Abnormal   Collection Time   11/13/11  7:47 AM      Component Value Range Comment   WBC 3.9 (*) 4.0 - 10.5 (K/uL)    RBC 4.26  4.22 - 5.81 (MIL/uL)    Hemoglobin 14.6  13.0 - 17.0 (g/dL)    HCT 96.0  45.4 - 09.8 (%)    MCV 96.7  78.0 - 100.0 (fL)    MCH 34.3 (*) 26.0 - 34.0 (pg)    MCHC 35.4  30.0 - 36.0 (g/dL)    RDW 11.9  14.7 - 82.9 (%)    Platelets 175  150 - 400 (K/uL)   DIFFERENTIAL     Status: Normal   Collection Time   11/13/11  7:47 AM      Component Value Range Comment   Neutrophils Relative 61  43 - 77 (%)    Neutro Abs 2.4  1.7 - 7.7 (K/uL)    Lymphocytes Relative 28  12 - 46 (%)    Lymphs Abs 1.1  0.7 - 4.0 (K/uL)    Monocytes Relative 9  3 - 12 (%)    Monocytes Absolute 0.3  0.1 - 1.0 (K/uL)    Eosinophils Relative 2  0 - 5 (%)    Eosinophils Absolute 0.1  0.0 - 0.7 (K/uL)    Basophils Relative 0  0 - 1 (%)    Basophils Absolute 0.0  0.0 - 0.1 (K/uL)   COMPREHENSIVE METABOLIC PANEL     Status: Normal   Collection Time   11/13/11  7:47 AM      Component Value Range Comment   Sodium 141  135 - 145 (mEq/L)    Potassium 3.5  3.5 - 5.1 (mEq/L)    Chloride 106  96 - 112 (mEq/L)    CO2 24  19 - 32 (mEq/L)    Glucose, Bld 91  70 - 99 (mg/dL)    BUN 13  6 - 23 (mg/dL)    Creatinine, Ser 5.62  0.50 - 1.35 (mg/dL)    Calcium 8.8  8.4 - 10.5 (mg/dL)    Total Protein 6.7  6.0 - 8.3 (g/dL)    Albumin 3.5  3.5 - 5.2 (g/dL)    AST 15  0 - 37 (U/L)    ALT 15  0 - 53 (U/L)    Alkaline Phosphatase 107  39 - 117 (U/L)    Total Bilirubin 0.4  0.3 - 1.2 (mg/dL)    GFR calc non Af Amer >90  >90 (mL/min)    GFR calc Af Amer >90  >90 (mL/min)   PHENYTOIN LEVEL, TOTAL     Status: Abnormal   Collection Time   11/13/11  7:47 AM      Component Value Range Comment   Phenytoin Lvl 5.0 (*) 10.0 -  20.0 (ug/mL)   CARBAMAZEPINE LEVEL, TOTAL     Status: Abnormal   Collection Time   11/13/11  7:47 AM      Component Value Range Comment   Carbamazepine Lvl  3.7 (*) 4.0 - 12.0 (ug/mL)   URINALYSIS, ROUTINE W REFLEX MICROSCOPIC     Status: Normal   Collection Time   11/13/11  9:13 AM      Component Value Range Comment   Color, Urine YELLOW  YELLOW     APPearance CLEAR  CLEAR     Specific Gravity, Urine 1.026  1.005 - 1.030     pH 7.0  5.0 - 8.0     Glucose, UA NEGATIVE  NEGATIVE (mg/dL)    Hgb urine dipstick NEGATIVE  NEGATIVE     Bilirubin Urine NEGATIVE  NEGATIVE     Ketones, ur NEGATIVE  NEGATIVE (mg/dL)    Protein, ur NEGATIVE  NEGATIVE (mg/dL)    Urobilinogen, UA 1.0  0.0 - 1.0 (mg/dL)    Nitrite NEGATIVE  NEGATIVE     Leukocytes, UA NEGATIVE  NEGATIVE  MICROSCOPIC NOT DONE ON URINES WITH NEGATIVE PROTEIN, BLOOD, LEUKOCYTES, NITRITE, OR GLUCOSE <1000 mg/dL.    Ct Head Wo Contrast  11/13/2011  *RADIOLOGY REPORT*  Clinical Data: Severe dizziness, left-sided facial numbness, and slurred speech.  Remote history of avium resection.  CT HEAD WITHOUT CONTRAST  Technique:  Contiguous axial images were obtained from the base of the skull through the vertex without contrast.  Comparison: CTA head 01/10/2010.  Findings: An aneurysm clip at the basilar tip is stable.  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  There is a remote infarct of the left thalamus.  Minimal white matter disease is evident.  No acute infarct, hemorrhage, or mass lesion is present.  Remote encephalomalacia of the left parietal and occipital lobe is stable.  The patient is status post left parietal craniotomy and right temporal craniotomy as before.  The paranasal sinuses and mastoid air cells are clear.  IMPRESSION:  1.  No acute intracranial abnormality or significant interval change. 2.  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 3.  Stable appearance of multiple aneurysm clips. 4.  Remote lacunar infarct of the left thalamus.   Original Report Authenticated By: Jamesetta Orleans. MATTERN, M.D.   Dg Chest Portable 1 View  11/13/2011  *RADIOLOGY REPORT*  Clinical Data: Dizziness and vomiting.  PORTABLE CHEST - 1 VIEW  Comparison: No comparison studies available.  Findings: No focal airspace consolidation or pulmonary edema. There is some atelectasis at the left base. The cardiopericardial silhouette is within normal limits for size. Imaged bony structures of the thorax are intact. Telemetry leads overlie the chest.  IMPRESSION: Minimal left base atelectasis.  Original Report Authenticated By: ERIC A. MANSELL, M.D.     Assessment/Plan:   51 YO male with new onset left facial numbness, ataxic gait, dysarthria and difficulty swolling.  CT head shows no acute infarct and due to intracranial clips MRI is pending.  Most likely left brainstem/cerebellar CVA.    Recommend:  1) MRI head, MRA head and neck if capable 2) 2 D- echo 3) carotid doppler 4) FLP and Hba1c 5) Telemetry 6) Keep MAP 120-130 7) Neuro check q 4 hours   Felicie Morn PA-C [facility] [phone]  11/13/2011, 10:33 AM

## 2011-11-13 NOTE — H&P (Signed)
Internal Medicine Teaching Service Attending Note Date: 11/13/2011  Patient name: Eric Craig  Medical record number: [id number]  Date of birth: Oct 25, 1960   I have seen and evaluated Jorge Ny and discussed their care with the Residency Team.    Physical Exam: Blood pressure 158/90, pulse 69, temperature 98.5 F (36.9 C), temperature source Oral, resp. rate 18, height 5\' 11"  (1.803 m), weight 167 lb 1.7 oz (75.8 kg), SpO2 97.00%. BP 158/90  Pulse 69  Temp(Src) 98.5 F (36.9 C) (Oral)  Resp 18  Ht 5\' 11"  (1.803 m)  Wt 167 lb 1.7 oz (75.8 kg)  BMI 23.31 kg/m2  SpO2 97%  General Appearance:    Alert, cooperative, no distress, appears stated age  Head:    Normocephalic, without obvious abnormality, atraumatic  Eyes:    PERRL, conjunctiva/corneas clear, EOM's intact, fundi    benign, both eyes       Ears:    Normal TM's and external ear canals, both ears  Nose:   Nares normal, septum midline, mucosa normal, no drainage   or sinus tenderness  Throat:   Lips, mucosa, and tongue normal; teeth and gums normal  Neck:   Supple, symmetrical, trachea midline, no adenopathy;        Back:     Symmetric, no curvature, ROM normal, no CVA tenderness  Lungs:     Clear to auscultation bilaterally, respirations unlabored  Chest wall:    No tenderness or deformity  Heart:    Regular rate and rhythm, S1 and S2 normal, no murmur, rub   or gallop  Abdomen:     Soft, non-tender, bowel sounds active all four quadrants,    no masses, no organomegaly        Extremities:   Extremities normal, atraumatic, no cyanosis or edema  Pulses:   2+ and symmetric all extremities  Skin:   Skin color, texture, turgor normal, no rashes or lesions. Surgical scars on scalp  Lymph nodes:   Cervical, supraclavicular, and axillary nodes normal  Neurologic:   CNII-XII  intact. Visual field deficit in right lower visual fields.nystagmus with left gaze. Normal pinprick and soft-touch to face, arms, hands, and feet  bilaterally. 5/5 strength of grip, leg flexion, leg extension, and feet bilaterally. 5/5 strength of right arm flexion and extension. 4/5 strength of left arm flexion and extension. 2+ radial, tricep, and right patellar reflexes. 1+ left patellar reflex. Abnormal finger-nose-finger with left arm. Normal rapid alternating finger movements bilaterally. Leans to left when eyes are closed when standing.    Lab results: Results for orders placed during the hospital encounter of 11/13/11 (from the past 24 hour(s))  GLUCOSE, CAPILLARY     Status: Abnormal   Collection Time   11/13/11  7:39 AM      Component Value Range   Glucose-Capillary 104 (*) 70 - 99 (mg/dL)  PROTIME-INR     Status: Normal   Collection Time   11/13/11  7:47 AM      Component Value Range   Prothrombin Time 13.2  11.6 - 15.2 (seconds)   INR 0.98  0.00 - 1.49   APTT     Status: Normal   Collection Time   11/13/11  7:47 AM      Component Value Range   aPTT 30  24 - 37 (seconds)  CBC     Status: Abnormal   Collection Time   11/13/11  7:47 AM      Component Value Range  WBC 3.9 (*) 4.0 - 10.5 (K/uL)   RBC 4.26  4.22 - 5.81 (MIL/uL)   Hemoglobin 14.6  13.0 - 17.0 (g/dL)   HCT 16.1  09.6 - 04.5 (%)   MCV 96.7  78.0 - 100.0 (fL)   MCH 34.3 (*) 26.0 - 34.0 (pg)   MCHC 35.4  30.0 - 36.0 (g/dL)   RDW 40.9  81.1 - 91.4 (%)   Platelets 175  150 - 400 (K/uL)  DIFFERENTIAL     Status: Normal   Collection Time   11/13/11  7:47 AM      Component Value Range   Neutrophils Relative 61  43 - 77 (%)   Neutro Abs 2.4  1.7 - 7.7 (K/uL)   Lymphocytes Relative 28  12 - 46 (%)   Lymphs Abs 1.1  0.7 - 4.0 (K/uL)   Monocytes Relative 9  3 - 12 (%)   Monocytes Absolute 0.3  0.1 - 1.0 (K/uL)   Eosinophils Relative 2  0 - 5 (%)   Eosinophils Absolute 0.1  0.0 - 0.7 (K/uL)   Basophils Relative 0  0 - 1 (%)   Basophils Absolute 0.0  0.0 - 0.1 (K/uL)  COMPREHENSIVE METABOLIC PANEL     Status: Normal   Collection Time   11/13/11  7:47 AM       Component Value Range   Sodium 141  135 - 145 (mEq/L)   Potassium 3.5  3.5 - 5.1 (mEq/L)   Chloride 106  96 - 112 (mEq/L)   CO2 24  19 - 32 (mEq/L)   Glucose, Bld 91  70 - 99 (mg/dL)   BUN 13  6 - 23 (mg/dL)   Creatinine, Ser 7.82  0.50 - 1.35 (mg/dL)   Calcium 8.8  8.4 - 95.6 (mg/dL)   Total Protein 6.7  6.0 - 8.3 (g/dL)   Albumin 3.5  3.5 - 5.2 (g/dL)   AST 15  0 - 37 (U/L)   ALT 15  0 - 53 (U/L)   Alkaline Phosphatase 107  39 - 117 (U/L)   Total Bilirubin 0.4  0.3 - 1.2 (mg/dL)   GFR calc non Af Amer >90  >90 (mL/min)   GFR calc Af Amer >90  >90 (mL/min)  PHENYTOIN LEVEL, TOTAL     Status: Abnormal   Collection Time   11/13/11  7:47 AM      Component Value Range   Phenytoin Lvl 5.0 (*) 10.0 - 20.0 (ug/mL)  CARBAMAZEPINE LEVEL, TOTAL     Status: Abnormal   Collection Time   11/13/11  7:47 AM      Component Value Range   Carbamazepine Lvl 3.7 (*) 4.0 - 12.0 (ug/mL)  URINALYSIS, ROUTINE W REFLEX MICROSCOPIC     Status: Normal   Collection Time   11/13/11  9:13 AM      Component Value Range   Color, Urine YELLOW  YELLOW    APPearance CLEAR  CLEAR    Specific Gravity, Urine 1.026  1.005 - 1.030    pH 7.0  5.0 - 8.0    Glucose, UA NEGATIVE  NEGATIVE (mg/dL)   Hgb urine dipstick NEGATIVE  NEGATIVE    Bilirubin Urine NEGATIVE  NEGATIVE    Ketones, ur NEGATIVE  NEGATIVE (mg/dL)   Protein, ur NEGATIVE  NEGATIVE (mg/dL)   Urobilinogen, UA 1.0  0.0 - 1.0 (mg/dL)   Nitrite NEGATIVE  NEGATIVE    Leukocytes, UA NEGATIVE  NEGATIVE     Imaging results:  Ct  Head Wo Contrast  11/13/2011  *RADIOLOGY REPORT*  Clinical Data: Severe dizziness, left-sided facial numbness, and slurred speech.  Remote history of avium resection.  CT HEAD WITHOUT CONTRAST  Technique:  Contiguous axial images were obtained from the base of the skull through the vertex without contrast.  Comparison: CTA head 01/10/2010.  Findings: An aneurysm clip at the basilar tip is stable.  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  There is a remote infarct of the left thalamus.  Minimal white matter disease is evident.  No acute infarct, hemorrhage, or mass lesion is present.  Remote encephalomalacia of the left parietal and occipital lobe is stable.  The patient is status post left parietal craniotomy and right temporal craniotomy as before.  The paranasal sinuses and mastoid air cells are clear.  IMPRESSION:  1.  No acute intracranial abnormality or significant interval change. 2.  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 3.  Stable appearance of multiple aneurysm clips. 4.  Remote lacunar infarct of the left thalamus.  Original Report Authenticated By: Jamesetta Orleans. MATTERN, M.D.   Mr Brain Wo Contrast  11/13/2011  *RADIOLOGY REPORT*  Clinical Data: Left-sided facial numbness and dizziness.  Slurred speech.  Status post history of aneurysm clipping and avium resection. The record regarding aneurysm clips for obtained and cleared as safe for MRI.  MRI HEAD WITHOUT CONTRAST  Technique:  Multiplanar, multiecho pulse sequences of the brain and surrounding structures were obtained according to standard protocol without intravenous contrast.  Comparison: CT head without contrast 11/13/2011.  Findings: There is some artifact from the aneurysm clips both at the basilar tip and within the left occipital and parietal lobe. The diffusion weighted images demonstrate no acute or subacute infarction. No hemorrhage or mass lesion is present. Encephalomalacia of the left parietal and occipital lobe is stable.  The ventricles are of normal size.  No significant extra-axial fluid collection is present.  Flow is present in the major intracranial arteries. There is scattered opacification of ethmoid air cells.  Minimal mucosal thickening is evident within the anterior sphenoid sinuses and at  the floor of the maxillary sinuses bilaterally.  IMPRESSION:  1.  No acute intracranial abnormality. 2. Postoperative changes of bilateral craniotomies with multiple aneurysm clips as described. 3.  Stable encephalomalacia in the left occipital and parietal lobe. 4.  Sinus disease.  Original Report Authenticated By: Jamesetta Orleans. MATTERN, M.D.   Dg Chest Portable 1 View  11/13/2011  *RADIOLOGY REPORT*  Clinical Data: Dizziness and vomiting.  PORTABLE CHEST - 1 VIEW  Comparison: No comparison studies available.  Findings: No focal airspace consolidation or pulmonary edema. There is some atelectasis at the left base. The cardiopericardial silhouette is within normal limits for size. Imaged bony structures of the thorax are intact. Telemetry leads overlie the chest.  IMPRESSION: Minimal left base atelectasis.  Original Report Authenticated By: ERIC A. MANSELL, M.D.    Assessment and Plan: Mr. Coupe is a 51yo Male with history of L parietal AVM, Seizure disorder, and h/o basilar artery aneurysm s/p clipping, now presents with > 12hrs of dizziness, dysarthria, left facial numbness and gait ataxia that is somewhat improving concerning for cerebellar stroke vs. TIA  I agree with the formulated Assessment and Plan as described above by Dr. Eben Burow, with the following changes:  -- will discuss with neurology and neuroradiology if patient would benefit from also  getting MRA if this would aid in diagnosis of TIA.   Duke Salvia Drue Second MD MPH [facility] [phone]

## 2011-11-13 NOTE — Progress Notes (Signed)
  Echocardiogram 2D Echocardiogram has been performed.  Eric Craig [facility] 11/13/2011, 3:06 PM

## 2011-11-13 NOTE — H&P (Signed)
Hospital Admission Note Date: 11/13/2011  Patient name:  Eric Craig  Medical record number:  [id number] Date of birth:  03-25-1961  Age: 51 y.o. Gender:  male PCP:    Minda Meo, MD, MD  Medical Service:   Internal Medicine Teaching Service   Attending physician:  Dr. Estrellita Ludwig Hours (7AM-5PM):    First Contact:             Dr. Sharyl Nimrod                         Pager: [phone] Second Contact: Dr. Eben Burow                             Pager: [phone]      After Hours (after 5PM)/ Weekend / Holidays: First Contact:              Pager: [phone] Second Contact:         Pager: [phone]     Chief Complaint: Dizzyness  History of Present Illness: Patient is a 51 y.o. 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 Patient was ready to go to bed a day prior to admission when he felt dizzy and hot flashes but ignored it. Upon waking in the morning he had difficulty getting out of bed because of the same dizzy sensation. When he tried to walk he would fall to the left. Patient has the sensation to fall to the left. Patient has weakness in left upper extremity but denies any weakness in left lower extremity. Patient complains of intermittent dizziness over the past month, however the falling sensation and dysphagia are new. Patient complains of a ringing in his ears over the past several weeks, numbness along his left jaw that feels like "being numb at the dentist", headache, and non-bloody vomiting. Patient endorses headache along with his symptoms. Patient denies any chest pain, palpitations, or shortness of breath. Patient says his vision is "normal" and denies diplopia. Patient denies any recent illnesses, fevers, diarrhea or abdominal discomfort.     Current Outpatient Medications: Current Facility-Administered Medications  Medication Dose Route  Frequency Provider Last Rate Last Dose  . 0.9 %  sodium chloride infusion  250 mL Intravenous PRN Lars Mage, MD      . aspirin suppository 300 mg  300 mg Rectal Daily Vania Rea. Katrinka Blazing, PA      . carbamazepine (TEGRETOL XR) 12 hr tablet 200 mg  200 mg Oral Daily Lars Mage, MD      . carbamazepine (TEGRETOL XR) 12 hr tablet 300 mg  300 mg Oral QHS Tewana Bohlen, MD      . enoxaparin (LOVENOX) injection 40 mg  40 mg Subcutaneous Q24H Lars Mage, MD   40 mg at 11/13/11 1735  . ondansetron (ZOFRAN) 4 MG/2ML injection           . ondansetron (ZOFRAN) injection 4 mg  4 mg Intravenous Once Carleene Cooper III, MD   4 mg at 11/13/11 0809  . ondansetron (ZOFRAN) tablet 4 mg  4 mg Oral Q6H PRN Lars Mage, MD   4 mg at 11/13/11 1635   Or  . ondansetron (ZOFRAN) injection 4 mg  4 mg Intravenous Q6H PRN Lars Mage, MD      . phenytoin (DILANTIN) ER  capsule 100 mg  100 mg Oral QHS Lars Mage, MD      . phenytoin (DILANTIN) ER capsule 200 mg  200 mg Oral Daily Nyjai Graff, MD      . sodium chloride 0.9 % injection 3 mL  3 mL Intravenous Q12H Hazelynn Mckenny, MD      . sodium chloride 0.9 % injection 3 mL  3 mL Intravenous PRN Lars Mage, MD      . DISCONTD: carbamazepine (CARBATROL) 12 hr capsule 200-300 mg  200-300 mg Oral BID Lars Mage, MD      . DISCONTD: phenytoin (DILANTIN) ER capsule 100-200 mg  100-200 mg Oral BID Lars Mage, MD        Allergies: Review of patient's allergies indicates no known allergies.  Past Medical History: Past Medical History  Diagnosis Date  . 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 Seizures     Last seizure was 2001    Past Surgical History: Past Surgical History  Procedure Date  . Neck surgery     C4-5 repair in 1989. C6-7 repair in 1993 and 2001. Residual left arm weakness  . Brain surgery     Basilar artery aneurysm repair in 1997, with facial reconstruction in 1998    Family History: Family History  Problem Relation Age of  Onset  . Aortic aneurysm Mother 58  . Hypertension Father     Social History: History   Social History  . Marital Status: Single    Spouse Name: N/A    Number of Children: 2  . Years of Education: N/A   Occupational History  . Electrician [employer]   Social History Main Topics  . Smoking status: Current Everyday Smoker -- 1.0 packs/day for 40 years    Types: Cigarettes  . Smokeless tobacco: Not on file  . Alcohol Use: Yes     Drinks 12 beers per year  . Drug Use: No  . Sexually Active: Not on file   Other Topics Concern  . Not on file   Social History Narrative   Lives at home with his wife (cell phone number [phone]). Has 2 daughters that accompany him here today.     Review of Systems: Pertinent items are noted in HPI.  Vital Signs: Filed Vitals:   11/13/11 1700  BP: 162/77  Pulse: 62  Temp: 98.5 F (36.9 C)  Resp: 18     Physical Exam: Constitutional: Alert and orientated to person, time,and place.  Head: Well healed scar on forehead. Normocephalic. Atraumatic  Eyes: EOMI. Visual Field deficit in right lower visual fields. Pupils equally round and reactive to light and accomodation. Horizontal nystagmus No cupping of optic disk with normal red reflex.  Pulmonary: Crackles in lower lung fields. No wheezing or rhonchii  Cardiovascular: Bradycardic. Regular rhythm. No murmurs, rubs, or gallops. No JVD. 2+ radial and pedal pulses. Capillary refill < 2s.  Gastrointestinal: Bowel sounds present. Soft. Non-tender. No hepatosplenomegaly.  Extremities: No LE edema. Acyanotic  Musculoskeleton: See neuro exam Neuro: CN 2-12 intact. Visual field deficit in right lower visual fields. Normal pinprick and soft-touch to face, arms, hands, and feet bilaterally. 5/5 strength of grip, leg flexion, leg extension, and feet bilaterally. 5/5 strength of right arm flexion and extension. 4/5 strength of left arm flexion and extension. 2+ radial, tricep, and right patellar  reflexes. 1+ left patellar reflex. Abnormal finger-nose-finger with left arm. Normal rapid alternating finger movements bilaterally.  Lab results: CBC:    Component Value Date/Time   WBC 3.9* 11/13/2011 0747   HGB 14.6 11/13/2011 0747   HCT 41.2 11/13/2011 0747   PLT 175 11/13/2011 0747   MCV 96.7 11/13/2011 0747   NEUTROABS 2.4 11/13/2011 0747   LYMPHSABS 1.1 11/13/2011 0747   MONOABS 0.3 11/13/2011 0747   EOSABS 0.1 11/13/2011 0747   BASOSABS 0.0 11/13/2011 0747      Comprehensive Metabolic Panel:    Component Value Date/Time   NA 141 11/13/2011 0747   K 3.5 11/13/2011 0747   CL 106 11/13/2011 0747   CO2 24 11/13/2011 0747   BUN 13 11/13/2011 0747   CREATININE 0.88 11/13/2011 0747   GLUCOSE 91 11/13/2011 0747   CALCIUM 8.8 11/13/2011 0747   AST 15 11/13/2011 0747   ALT 15 11/13/2011 0747   ALKPHOS 107 11/13/2011 0747   BILITOT 0.4 11/13/2011 0747   PROT 6.7 11/13/2011 0747   ALBUMIN 3.5 11/13/2011 0747     No results found for this basename: CKTOTAL, CKMB, CKMBINDEX, TROPONINI      Imaging results:   CXR: IMPRESSION:  Minimal left base atelectasis.   CT head without contrast  IMPRESSION:  1. No acute intracranial abnormality or significant interval  change.  2. Stable left parietal and occipital encephalomalacia following  resection of an arteriovenous malformation.  3. Stable appearance of multiple aneurysm clips.  4. Remote lacunar infarct of the left thalamus.    MRI head without contrast:  IMPRESSION:  1. No acute intracranial abnormality.  2. Postoperative changes of bilateral craniotomies with multiple  aneurysm clips as described.  3. Stable encephalomalacia in the left occipital and parietal  lobe.  4. Sinus disease.    Assessment & Plan:  51 yo man with previous h/o aneurysm repair comes in today with symptoms concerning for new stroke.  1. Cerebellar stroke 2. Seizure disorder 3. HTN 4. AVM 5. Cervical spine degeneration  We will Admit on telemetry and q4h neuro  checks, Orthostatic vital signs, Carotid dopplars pending, Follow neurology recommendations for further aspirin recommendations. Follow up on swallow study for diet management. A1C and lipid panel are pending. We will continue his other home meds. BP will be managed to allow permissive HTN for first 24 hours.   DVT PPX: Lovenox    Lars Mage MD [facility] Internal Medicine Resident Pager [phone]

## 2011-11-13 NOTE — H&P (Signed)
Please refer to H & P from Dr. Lars Mage.  Patient has been seen by myself, and assessment and plan discussed with Wilmer Floor and Dr. Lars Mage. We will continue with the plan as outlined above.  Duke Salvia Drue Second MD MPH [facility] [phone]

## 2011-11-14 ENCOUNTER — Inpatient Hospital Stay (HOSPITAL_COMMUNITY): Payer: 59

## 2011-11-14 LAB — LIPID PANEL
Cholesterol: 183 mg/dL (ref 0–200)
LDL Cholesterol: 91 mg/dL (ref 0–99)
Total CHOL/HDL Ratio: 3.7 RATIO
Triglycerides: 211 mg/dL — ABNORMAL HIGH (ref <150)
VLDL: 42 mg/dL — ABNORMAL HIGH (ref 0–40)

## 2011-11-14 MED ORDER — GADOBENATE DIMEGLUMINE 529 MG/ML IV SOLN
18.0000 mL | Freq: Once | INTRAVENOUS | Status: AC | PRN
  Administered 2011-11-14: 18 mL via INTRAVENOUS

## 2011-11-14 MED ORDER — DIPHENHYDRAMINE HCL 25 MG PO CAPS
25.0000 mg | ORAL_CAPSULE | Freq: Every evening | ORAL | Status: DC | PRN
  Administered 2011-11-14 – 2011-11-15 (×2): 25 mg via ORAL
  Filled 2011-11-14 (×2): qty 1

## 2011-11-14 MED ORDER — ASPIRIN 325 MG PO TABS
325.0000 mg | ORAL_TABLET | Freq: Every day | ORAL | Status: DC
  Administered 2011-11-14: 325 mg via ORAL
  Filled 2011-11-14 (×2): qty 1

## 2011-11-14 MED ORDER — PANTOPRAZOLE SODIUM 40 MG PO TBEC
40.0000 mg | DELAYED_RELEASE_TABLET | Freq: Every day | ORAL | Status: DC
  Administered 2011-11-14 – 2011-11-15 (×2): 40 mg via ORAL
  Filled 2011-11-14 (×2): qty 1

## 2011-11-14 NOTE — Progress Notes (Signed)
Subjective:  Patient is tolerating solid food without vomiting. Left mandibular numbness and dysphagia are improving. Dizziness and tinnitis are slightly worse. Patient has difficulty walking without assistance. He denies fever, chills, chest pain, shortness of breath, diarrhea, constipation. He denies any changes in vision. Patient mostly gets dizzy when he stands up.   Objective: Vital signs in last 24 hours: Filed Vitals:   11/13/11 1702 11/13/11 2200 11/14/11 0200 11/14/11 0600  BP: 158/90 150/59 122/74 119/76  Pulse: 69 68 63 63  Temp:  98.5 F (36.9 C) 98.7 F (37.1 C) 98.6 F (37 C)  TempSrc:  Oral Oral Oral  Resp:  18 20 18   Height:      Weight:      SpO2:  94% 97% 95%    Physical Exam: Constitutional: Alert and orientated to person, time,and place.  Head: Well healed scar on forehead. Normocephalic. Atraumatic  Eyes: EOMI. Visual Field deficit in right lower visual fields. Pupils equally round and reactive to light and accomodation. Horizontal nystagmus, most pronounced on rightward gaze.  Neck: No thyroidmegaly. No cervical lymphadenopathy.  Pulmonary: Crackles in lower lung fields. No wheezing or rhonchii  Cardiovascular: Bradycardic. Regular rhythm. No murmurs, rubs, or gallops. No JVD. 2+ radial and pedal pulses. Capillary refill < 2s.  Gastrointestinal: Bowel sounds present. Soft. Non-tender. No hepatosplenomegaly.  Extremities: No LE edema. Acyanotic  Neuro: CN 2-12 intact. Visual field deficit in right lower visual fields. Normal pinprick and soft-touch to face, arms, hands, and feet bilaterally. 5/5 strength of grip, leg flexion, leg extension, and feet bilaterally. 5/5 strength of right arm flexion and extension. 4/5 strength of left arm flexion and extension. 2+ radial, tricep, and right patellar reflexes. 1+ left patellar reflex. Abnormal finger-nose-finger with left arm. Normal rapid alternating finger movements bilaterally. Patient falls to left upon standing. Eply  maneuver does not produce nystagmus.   Studies/Results: Ct Head Wo Contrast  11/13/2011 IMPRESSION:  1.  No acute intracranial abnormality or significant interval change. 2.  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 3.  Stable appearance of multiple aneurysm clips. 4.  Remote lacunar infarct of the left thalamus.  Original Report Authenticated By: Jamesetta Orleans. MATTERN, M.D.   Mr Brain Wo Contrast  11/13/2011 IMPRESSION:  1.  No acute intracranial abnormality. 2. Postoperative changes of bilateral craniotomies with multiple aneurysm clips as described. 3.  Stable encephalomalacia in the left occipital and parietal lobe. 4.  Sinus disease.  Original Report Authenticated By: Jamesetta Orleans. MATTERN, M.D.   Dg Chest Portable 1 View  11/13/2011 IMPRESSION: Minimal left base atelectasis.  Original Report Authenticated By: ERIC A. MANSELL, M.D.   Transthoracic echocardiogram  -11/13/2011 Left ventricle: The cavity size was normal. Wall thickness was normal. Systolic function was normal. The estimated ejection fraction was in the range of 60% to 65%. Doppler parameters are consistent with abnormal left ventricular relaxation (grade 1 diastolic dysfunction). - Right atrium: The atrium was mildly dilated. - Pulmonary arteries: PA peak pressure: 32mm Hg (S).  Medications: I have reviewed the patient's current medications. Scheduled Meds:   . aspirin  325 mg Oral Daily  . carbamazepine  200 mg Oral Daily  . carbamazepine  300 mg Oral QHS  . enoxaparin  40 mg Subcutaneous Q24H  . ibuprofen  600 mg Oral Once  . ondansetron      . phenytoin  100 mg Oral QHS  . phenytoin  200 mg Oral Daily  . sodium chloride  3  mL Intravenous Q12H  . DISCONTD: aspirin  300 mg Rectal Daily  . DISCONTD: carbamazepine  200-300 mg Oral BID  . DISCONTD: phenytoin  100-200 mg Oral BID   Continuous Infusions:  PRN Meds:.sodium chloride, ondansetron (ZOFRAN) IV,  ondansetron, sodium chloride Assessment/Plan:  Eric Craig is a 51 year old man with a PMH significant for left parietal AVM, basilar artery aneurysm clips, and seizures who presents with symptoms consistent with a cerebellar cerebrovascular accident or vestibular dysfunction. He is currently is stable condition.    1. Dizziness: Patient's symptoms are consistent with a cerebellar dysfunction. CVA work-up has not yielded an etiology at this time. However, neurologic deficits could be residual deficits from CVA or seizure. Patient's dizziness and tinnitus are slightly worse. However, patient does not have nystagmus on eply maneuver, making BPPV less likely. Patient is most dizzy when he stands, but orthostatics are normal.  -- ASA 325 -- MRA of the head and neck.  -- PT and OT evaluation  2. Seizure: The patient has not experienced any seizures. We are maintaining his outpatient reginem. Neurology considering EEG.  -- Carbamazepine 200mg  po qAM and 300mg  po qPM  -- Phenytoin 200mg  po qAM and 100mg  po qPM   3. Hypertension: The patient has a goal MAP 120-130, given his CVA potential. He is not hypertensive at baseline. -- Monitor BP   4. Arteriovenous Malformation: Longstanding. Likely responsible for patient's right lower quadrant visual field defects. Stable on CT.   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   6. Prophylaxis: Enoxaparin  LOS: 1 day  Eric Craig 11/14/2011, 11:22 AM   ADDENDUM:  Please see the complete note for MS4 for detailed. Patient was seen, examined and chart was reviewed in detail .    S:No new complaints. Speech has improved today. Neurology evaluated the patient today.   BP 149/82  Pulse 63  Temp(Src) 97.4 F (36.3 C) (Oral)  Resp 18  Ht 5\' 11"  (1.803 m)  Wt 167 lb 1.7 oz (75.8 kg)  BMI 23.31 kg/m2  SpO2 98%    General appearance: alert, cooperative, appears stated age, fatigued  and morbidly obese, sitting by the side of the bed  Lungs: clear to auscultation bilaterally  Heart: No JVD noted, Tachycardia, regular rhythm. Normal S1 and S2. No murmurs, rubs, or gallops. Her chest pain is palpable and reproducible below her left breast.  Abdomen: Soft, positive bowel sounds. abnormal findings: Obese. Mild epigastric and suprabupic tenderness.  Extremities: extremities normal, atraumatic, no cyanosis or edema  Pulses: 1+ and symmetric  Lymph nodes: Cervical, supraclavicular, and axillary nodes normal.  Neuro exam: Speech fluent without evidence of aphasia. Able to follow 3 step commands without difficulty.  Cranial Nerves: 2-12 intact Motor: 5/5 bilaterally with normal tone and bulk  Sensory: Pinprick and light touch intact throughout, bilaterally  Deep Tendon Reflexes: 2+ and symmetric throughout  Plantars: Downgoing bilaterally  Cerebellar: Mild dysmetria L U/LE. Normal FN-HS on R. Slower RAM on L and orbits Left hand. (Pt had difficulty maintaining sitting balance during exam, falling to the left.)   A and plan:  Eric Craig is a 51 y.o. male with significant pmh of neurological problems in the past as noted above now has  1. New L sided weakness and balance disturbance. CVA vs Todd's paralysis per neurology 2. 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  3. R visual field deficit chronic. Diploplia not present on exam.  4. Epilepsy- phenytoin level low.  5. HTN 6 H/o AVM's 7. Cervical spine degeneration  Plan: --No cause identified yet for the new developments. --MRI was negative;Ventricles are normal, no infarct or bleed. Needs MRA head without contrast and MRA neck with contrast per neurology.  --They have also increased his seizure meds.  --A1C pending. --already on aspiirn 325 --PT/OT eval today --continue lovenox for DVT px --Discharge planning based on PT/OT eval    Lars Mage MD  [facility] Internal Medicine Resident  Pager  [phone] 12:19 PM

## 2011-11-14 NOTE — Progress Notes (Signed)
Physical Therapy Evaluation Patient Details Name: Eric Craig MRN: [id number] DOB: [birth year] Today's Date: 11/14/2011  Problem List:  Patient Active Problem List  Diagnoses  . Stroke  . Seizure  . Hypertension    Past Medical History:  Past Medical History  Diagnosis Date  . 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 Seizures     Last seizure was 2001   Past Surgical History:  Past Surgical History  Procedure Date  . Neck surgery     C4-5 repair in 1989. C6-7 repair in 1993 and 2001. Residual left arm weakness  . Brain surgery     Basilar artery aneurysm repair in 1997, with facial reconstruction in 1998    PT Assessment/Plan/Recommendation PT Assessment Clinical Impression Statement: Pt presents with a medical diagnosis of possible CVA along with the following impairments/deficits and therapy diagnosis listed below. Pt with decreased sitting and standing balance, decreased coordination and left listing during ambulation.  PT Recommendation/Assessment: Patient will need skilled PT in the acute care venue PT Problem List: Decreased activity tolerance;Decreased coordination;Decreased balance;Decreased mobility;Decreased knowledge of use of DME;Decreased safety awareness;Pain PT Therapy Diagnosis : Difficulty walking PT Plan PT Frequency: Min 4X/week PT Treatment/Interventions: DME instruction;Gait training;Stair training;Functional mobility training;Therapeutic activities;Therapeutic exercise;Balance training;Neuromuscular re-education;Patient/family education PT Recommendation Recommendations for Other Services: Rehab consult Follow Up Recommendations: Home health PT;Supervision/Assistance - 24 hour;Inpatient Rehab [facility] with 24/7 vs CIR pending progress) Equipment Recommended: Rolling walker with 5" wheels PT Goals  Acute Rehab PT Goals PT Goal Formulation: With patient Time For Goal Achievement: 7 days Pt will go Sit to Stand:  with modified independence PT Goal: Sit to Stand - Progress: Goal set today Pt will go Stand to Sit: with modified independence PT Goal: Stand to Sit - Progress: Goal set today Pt will Transfer Bed to Chair/Chair to Bed: with supervision PT Transfer Goal: Bed to Chair/Chair to Bed - Progress: Goal set today Pt will Ambulate: >150 feet;with supervision;with least restrictive assistive device PT Goal: Ambulate - Progress: Goal set today Pt will Go Up / Down Stairs: 1-2 stairs;with min assist;with least restrictive assistive device PT Goal: Up/Down Stairs - Progress: Goal set today  PT Evaluation Precautions/Restrictions  Precautions Precautions: Fall Restrictions Weight Bearing Restrictions: No Prior Functioning  Home Living Lives With: Spouse Receives Help From: Family Type of Home: House Home Layout: Two level;Able to live on main level with bedroom/bathroom Alternate Level Stairs-Rails: Left Alternate Level Stairs-Number of Steps: 13 Home Access: Stairs to enter Entrance Stairs-Rails: None Entrance Stairs-Number of Steps: 2 Bathroom Shower/Tub: Walk-in shower;Door [employer] Toilet: Standard Bathroom Accessibility: Yes How Accessible: Accessible via walker Home Adaptive Equipment: None Prior Function Level of Independence: Independent with basic ADLs;Independent with homemaking with ambulation;Independent with gait;Independent with transfers Able to Take Stairs?: Yes Driving: Yes Vocation: Full time employment Vocation Requirements: electrician Cognition Cognition Arousal/Alertness: Awake/alert Overall Cognitive Status: Appears within functional limits for tasks assessed Orientation Level: Oriented X4 Sensation/Coordination Sensation Light Touch: Appears Intact Coordination Finger Nose Finger Test: pt with dsymetric movements, both under and over shooting his finger to his note and other finger. Coordination improves with decreased speed Extremity Assessment RLE  Assessment RLE Assessment: Within Functional Limits LLE Assessment LLE Assessment: Within Functional Limits Mobility (including Balance) Bed Mobility Bed Mobility: Yes Supine to Sit: 6: Modified independent (Device/Increase time) Transfers Transfers: Yes Sit to Stand: 4: Min assist;With upper extremity assist;From bed Sit to Stand Details (indicate cue type and reason): [place]  for stability into standing secondary to left lateral lean. VC for hand placement for safety Stand to Sit: 4: Min assist;With upper extremity assist;To chair/3-in-1 Stand to Sit Details: VC for hand placement and controlled descent Ambulation/Gait Ambulation/Gait: Yes Ambulation/Gait Assistance: 3: Mod assist Ambulation/Gait Assistance Details (indicate cue type and reason): Mod assist to maintain upright positioning as well as stability. Pt tends to ambualte with a left lateral lean and instability. Ambulation Distance (Feet): 20 Feet Assistive device: Rolling walker Gait Pattern: Step-to pattern;Lateral trunk lean to left;Left steppage;Decreased stride length Gait velocity: decreased gait speed Stairs: No  Balance Balance Assessed: Yes Static Sitting Balance Static Sitting - Balance Support: Feet unsupported;No upper extremity supported Static Sitting - Level of Assistance: 3: Mod assist Static Sitting - Comment/# of Minutes: Mod assist to maintain sitting. Left lateral lean Static Standing Balance Single Leg Stance - Right Leg: 0  (unable to maintain balance with no UE support) Single Leg Stance - Left Leg: 0  (unable to maintain balance with no UE support) Rhomberg - Eyes Opened: 0  (unable to stand with feet together without UE assist) Exercise    End of Session PT - End of Session Equipment Utilized During Treatment: Gait belt Activity Tolerance: Patient tolerated treatment well Patient left: in chair;with call bell in reach Nurse Communication: Mobility status for transfers;Mobility status for  ambulation General Behavior During Session: [facility] for tasks performed Cognition: [facility] for tasks performed  Milana Kidney 11/14/2011, 12:42 PM  11/14/2011 Milana Kidney DPT PAGER: [phone] OFFICE: [phone]

## 2011-11-14 NOTE — Evaluation (Signed)
Clinical/Bedside Swallow Evaluation Patient Details  Name: Eric Craig MRN: [id number] DOB: 05-Oct-1961 Today's Date: 11/14/2011  Past Medical History:  Past Medical History  Diagnosis Date  . 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 Seizures     Last seizure was 2001   Past Surgical History:  Past Surgical History  Procedure Date  . Neck surgery     C4-5 repair in 1989. C6-7 repair in 1993 and 2001. Residual left arm weakness  . Brain surgery     Basilar artery aneurysm repair in 1997, with facial reconstruction in 1998   HPI:  51 y/o male with PMH of basilar artery aneurysm and seizures with presented to ED on 11-13-11 with dizziness with one day duration and difficulty swallowing. Patient's spouse and daughter present during evaluation and confirmed no prior reports of dysphagia. Patient passed RN stroke swallow screen but due to patient reporting "food" sticking and having to cough up "food" during meals BSE warranted.    Assessment/Recommendations/Treatment Plan Suspected Esophageal Findings Suspected Esophageal Findings: Globus sensation  SLP Assessment Clinical Impression Statement: Indicates oropharyngeal dysphagia with +s/s of aspiration during and after the swallow of thin liquid secondary to patient taking too large of sips and talking during the swallow. Strategies of small bites/small sips, swallow x2 with solids, and using effortful swallow effective in decreasing outward s/s of aspiration. Recommend full supervion with all meals secondary to decreased safety awareness to cue patient to complete necessary precautions. Recommend objective evaluation  of MBS to assess risk of aspiration secondary to s/s  present at BSE, PMH, and  due to patient reports of "food , (especially breads), sticking ".  ST to follow in acute setting for diet tolerance.  Risk for Aspiration: Moderate Other Related Risk Factors: Cognitive  impairment  Swallow Evaluation Recommendations Recommended Consults: MBS Solid Consistency: Regular/Avoid Breads Liquid Consistency: Thin Liquid Administration via: Cup Medication Administration: Whole meds with puree Supervision: Full supervision/cueing for compensatory strategies Compensations: Slow rate;Small sips/bites;Clear throat intermittently;Multiple dry swallows after each bite/sip;Follow solids with liquid;Effortful swallow Postural Changes and/or Swallow Maneuvers: Seated upright 90 degrees;Upright 30-60 min after meal Oral Care Recommendations: Oral care BID Other Recommendations: Clarify dietary restrictions Avoid Breads.  Recommendations for Other Services: Rehab consult Follow up Recommendations: Inpatient Rehab  Treatment Plan Speech Therapy Frequency: min 2x/week Treatment Duration: 2 weeks Interventions: Aspiration precaution training;Diet toleration management by SLP;Patient/family education;Other (Comment) (Pending results of MBS)  Prognosis Barriers to Reach Goals: Cognitive deficits  Individuals Consulted Consulted and Agree with Results and Recommendations: Patient;Family member/caregiver;RN  Swallowing Goals  SLP Swallowing Goals Patient will consume recommended diet without observed clinical signs of aspiration with: Minimal assistance Patient will utilize recommended strategies during swallow to increase swallowing safety with: Minimal assistance      General  Date of Onset: 11/13/11 HPI: 51 y/o male with PMH of basilar artery aneurysm and seizures with presented to ED on 11-13-11 with dizziness with one day duration and difficulty swallowing. Patient's spouse and daughter present during evaluation and confirmed no prior reports of dysphagia.  Type of Study: Bedside swallow evaluation Diet Prior to this Study: Regular;Thin liquids Temperature Spikes Noted: No Respiratory Status: Room air History of Intubation: No Behavior/Cognition:  Alert;Impulsive;Distractible;Requires cueing;Doesn't follow directions Oral Cavity - Dentition: Adequate natural dentition Vision: Functional for self-feeding Patient Positioning: Upright in bed Baseline Vocal Quality: Clear Volitional Cough: Strong Volitional Swallow: Able to elicit  Oral Motor/Sensory Function  Overall Oral Motor/Sensory Function:  Appears within functional limits for tasks assessed  Consistency Results  Ice Chips Ice chips: Not tested  Thin Liquid Thin Liquid: Impaired Presentation: Cup;Straw Pharyngeal  Phase Impairments: Throat Clearing - Delayed;Cough - Delayed;Throat Clearing - Immediate Other Comments: +s/s of aspiration after swallow secondary to too large of sips. Small sips effected in preventing outward s/s of aspiration.   Nectar Thick Liquid Nectar Thick Liquid: Not tested  Honey Thick Liquid Honey Thick Liquid: Not tested  Puree Puree: Within functional limits  Solid Solid: Impaired Pharyngeal Phase Impairments: Throat Clearing - Delayed;Cough - Delayed Other Comments: Patient required verbal and tactile cues to take small bites and to stop talking during oral prep stage. Compensatory strategy of small bites and swalllow x2 effective in eliminating s/s of aspiration. Moreen Fowler M.S., CCC-SLP [phone]  [facility] 11/14/2011,6:02 PM

## 2011-11-14 NOTE — Progress Notes (Addendum)
Stroke Team Progress Note  HISTORY Eric Craig is an 51 y.o. male PmHx COPD, visual defect, congenital anomaly of cerebrovascular system, diplopia, HTN, HA, epilepsy who is followed out patient by Dr. Sandria Manly of [facility]. Patient went to sleep last night at 12 and noted at that time to have difficulty swallowing his own secretions and broke out in " a cold sweat" just before going to sleep. He awoke at 6:15 this am and felt he could not keep his balance. He kept to listing to the left and wife noted a new left facial droop, left eye ptosis and patient noted left facial numbness. 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 Level of Consciousness (1a. ): Alert, keenly responsive (02/01 2000) LOC Questions (1b. ): Answers both questions correctly (02/01 2000) LOC Commands (1c. ): Performs both tasks correctly (02/01 2000) Best Gaze (2. ): Normal (02/01 2000) Visual (3. ): Partial hemianopia (02/01 2000) Facial Palsy (4. ): Normal symmetrical movements (02/01 2000) Motor Arm, Left (5a. ): No drift (02/01 2000) Motor Arm, Right (5b. ): No drift (02/01 2000) Motor Leg, Left (6a. ): No drift (02/01 2000) Motor Leg, Right (6b. ): No drift (02/01 2000) Limb Ataxia (7. ): Absent (02/01 2000) Sensory (8. ): Mild-to-moderate sensory loss, patient feels pinprick is less sharp or is dull on the affected side, or there is a loss of superficial pain with pinprick, but patient is aware of being touched (02/01 2000) Best Language (9. ): No aphasia (02/01 2000) Dysarthria (10. ): Normal (02/01 2000) Inattention/Extinction: No Abnormality (02/01 2000) Total: 2  (02/01 2000)  SUBJECTIVE Patient continues to have difficulty with sitting and standing balance.  Speech has improved and is able to swallow without difficulty.  Has some numbness to L anterior cheek.  OBJECTIVE Filed Vitals:   11/13/11 1702 11/13/11 2200 11/14/11 0200 11/14/11 0600  BP: 158/90  150/59 122/74 119/76  Pulse: 69 68 63 63  Temp:  98.5 F (36.9 C) 98.7 F (37.1 C) 98.6 F (37 C)  TempSrc:  Oral Oral Oral  Resp:  18 20 18   Height:      Weight:      SpO2:  94% 97% 95%    CBG (last 3)  Basename 11/13/11 0739  GLUCAP 104*   Intake/Output from previous day: 02/01 0701 - 02/02 0700 In: 360 [P.O.:360] Out: 350 [Urine:350]  IV Fluid Intake:    Medications    . aspirin  300 mg Rectal Daily  . carbamazepine  200 mg Oral Daily  . carbamazepine  300 mg Oral QHS  . enoxaparin  40 mg Subcutaneous Q24H  . ibuprofen  600 mg Oral Once  . ondansetron      . phenytoin  100 mg Oral QHS  . phenytoin  200 mg Oral Daily  . sodium chloride  3 mL Intravenous Q12H  . DISCONTD: carbamazepine  200-300 mg Oral BID  . DISCONTD: phenytoin  100-200 mg Oral BID  PRN sodium chloride, ondansetron (ZOFRAN) IV, ondansetron, sodium chloride  Diet:  General thin liquids Activity:   Up with assistance DVT Prophylaxis: lovenox  Mental Status: Alert, oriented, thought content appropriate.  Speech fluent without evidence of aphasia. Able to follow 3 step commands without difficulty. Cranial Nerves: II- Loss VA R lower quadrant (baseline) III/IV/VI-Extraocular movements intact.  Pupils reactive bilaterally.  Minimal ptosis OD. V/VII-Smile asymmetric with flattening L NLF. VIII-grossly intact IX/X-normal gag XI-bilateral shoulder  shrug XII-midline tongue extension Motor: 5/5 bilaterally with normal tone and bulk Sensory: Pinprick and light touch intact throughout, bilaterally Deep Tendon Reflexes: 2+ and symmetric throughout Plantars: Downgoing bilaterally Cerebellar: Mild dysmetria L U/LE. Normal FN-HS on R.  Slower RAM on L and orbits Left hand. (Pt had difficulty maintaining sitting balance during exam, falling to the left.)    Significant Diagnostic Studies: CBC    Component Value Date/Time   WBC 3.9* 11/13/2011 0747   RBC 4.26 11/13/2011 0747   HGB 14.6 11/13/2011 0747   HCT  41.2 11/13/2011 0747   PLT 175 11/13/2011 0747   MCV 96.7 11/13/2011 0747   MCH 34.3* 11/13/2011 0747   MCHC 35.4 11/13/2011 0747   RDW 12.9 11/13/2011 0747   LYMPHSABS 1.1 11/13/2011 0747   MONOABS 0.3 11/13/2011 0747   EOSABS 0.1 11/13/2011 0747   BASOSABS 0.0 11/13/2011 0747   CMP    Component Value Date/Time   NA 141 11/13/2011 0747   K 3.5 11/13/2011 0747   CL 106 11/13/2011 0747   CO2 24 11/13/2011 0747   GLUCOSE 91 11/13/2011 0747   BUN 13 11/13/2011 0747   CREATININE 0.88 11/13/2011 0747   CALCIUM 8.8 11/13/2011 0747   PROT 6.7 11/13/2011 0747   ALBUMIN 3.5 11/13/2011 0747   AST 15 11/13/2011 0747   ALT 15 11/13/2011 0747   ALKPHOS 107 11/13/2011 0747   BILITOT 0.4 11/13/2011 0747   GFRNONAA >90 11/13/2011 0747   GFRAA >90 11/13/2011 0747   COAGS Lab Results  Component Value Date   INR 0.98 11/13/2011   Lipid Panel    Component Value Date/Time   CHOL 183 11/14/2011 0008   TRIG 211* 11/14/2011 0008   HDL 50 11/14/2011 0008   CHOLHDL 3.7 11/14/2011 0008   VLDL 42* 11/14/2011 0008   LDLCALC 91 11/14/2011 0008    Phenytion level 11/13/11 5.0 (L)   11/13/2011    CT HEAD WITHOUT CONTRAST    Comparison: CTA head 01/10/2010.  Findings: An aneurysm clip at the basilar tip is stable.  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  There is a remote infarct of the left thalamus.  Minimal white matter disease is evident.  No acute infarct, hemorrhage, or mass lesion is present.  Remote encephalomalacia of the left parietal and occipital lobe is stable.  The patient is status post left parietal craniotomy and right temporal craniotomy as before.  The paranasal sinuses and mastoid air cells are clear.  IMPRESSION:  1.  No acute intracranial abnormality or significant interval change. 2.  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 3.  Stable appearance of multiple aneurysm clips. 4.   Remote lacunar infarct of the left thalamus.  CHRISTOPHER W. MATTERN, M.D.   11/13/2011   MRI HEAD WITHOUT CONTRAST    Findings: There is some artifact from the aneurysm clips both at the basilar tip and within the left occipital and parietal lobe. The diffusion weighted images demonstrate no acute or subacute infarction. No hemorrhage or mass lesion is present. Encephalomalacia of the left parietal and occipital lobe is stable.  The ventricles are of normal size.  No significant extra-axial fluid collection is present.  Flow is present in the major intracranial arteries. There is scattered opacification of ethmoid air cells.  Minimal mucosal thickening is evident within the anterior sphenoid sinuses and at the floor of the maxillary  sinuses bilaterally.  IMPRESSION:  1.  No acute intracranial abnormality. 2. Postoperative changes of bilateral craniotomies with multiple aneurysm clips as described. 3.  Stable encephalomalacia in the left occipital and parietal lobe. 4.  Sinus disease. CHRISTOPHER W. MATTERN, M.D.   11/13/2011 PORTABLE CHEST - 1 VIEW  Findings: No focal airspace consolidation or pulmonary edema. There is some atelectasis at the left base. The cardiopericardial silhouette is within normal limits for size. Imaged bony structures of the thorax are intact. Telemetry leads overlie the chest.  IMPRESSION: Minimal left base atelectasis.  ERIC A. MANSELL, M.D.   2D ECHO 11/13/11: - Left ventricle: The cavity size was normal. Wall thickness was normal.     Systolic function was normal. The estimated ejection fraction was in the     range of 60% to 65%. Doppler parameters are consistent with abnormal     left ventricular relaxation (grade 1 diastolic dysfunction).    - Right atrium: The atrium was mildly dilated.    - Pulmonary arteries: PA peak pressure: 32mm Hg (S).  ECG 11/13/11- (prelim) SR, poor R wave progression, R atrial abnormality.  No ischemia. Carotid Dopplers 11/13/11 (Prelim) No significant ICA  stenosis bilaterally.           Right vertebral flow is antegrade.           Left vertebral flow is atypical (A To and Fro waveform).   ASSESSMENT [name] is a 51 y.o. male with  1. New L sided weakness and balance disturbance.  CVA vs Todd's paralysis MRI was    negative;ventricles are normal, no infarct or bleed. Needs MRA head without    contrast and MRA neck with contrast.  A1C pending.  2. 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  3. R visual field deficit chronic.  Diploplia not present on exam.  4. Epilepsy- phenytoin level low-? Todd's paralysis?  Consider EEG.  Stroke risk factors: vascular disease. 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 The patient was not on aspirin PTA.  Hospital day # 1  TREATMENT/PLAN 1. Recommend EC ASA 81 mg daily for secondary stroke prevention. 2. MRA head and Neck 3.PT and OT eval.   Marya Fossa PA-C [facility] [phone] 11/14/11 0900  Lesly Dukes

## 2011-11-15 ENCOUNTER — Inpatient Hospital Stay (HOSPITAL_COMMUNITY): Payer: 59

## 2011-11-15 MED ORDER — PHENYTOIN SODIUM EXTENDED 100 MG PO CAPS
200.0000 mg | ORAL_CAPSULE | Freq: Every day | ORAL | Status: DC
  Administered 2011-11-15: 200 mg via ORAL
  Filled 2011-11-15 (×2): qty 2

## 2011-11-15 MED ORDER — ASPIRIN EC 81 MG PO TBEC
81.0000 mg | DELAYED_RELEASE_TABLET | Freq: Every day | ORAL | Status: DC
  Administered 2011-11-16: 81 mg via ORAL
  Filled 2011-11-15 (×2): qty 1

## 2011-11-15 MED ORDER — IBUPROFEN 200 MG PO TABS
200.0000 mg | ORAL_TABLET | Freq: Four times a day (QID) | ORAL | Status: DC | PRN
  Administered 2011-11-15: 200 mg via ORAL
  Filled 2011-11-15 (×2): qty 1

## 2011-11-15 MED ORDER — IBUPROFEN 400 MG PO TABS
400.0000 mg | ORAL_TABLET | Freq: Once | ORAL | Status: AC
  Administered 2011-11-15: 400 mg via ORAL
  Filled 2011-11-15: qty 1

## 2011-11-15 MED ORDER — ASPIRIN 81 MG PO CHEW
CHEWABLE_TABLET | ORAL | Status: AC
  Administered 2011-11-15: 81 mg
  Filled 2011-11-15: qty 1

## 2011-11-15 NOTE — Progress Notes (Signed)
Physical Therapy Treatment Patient Details Name: Eric Craig MRN: [id number] DOB: 08/25/1961 Today's Date: 11/15/2011  PT Assessment/Plan  PT - Assessment/Plan Comments on Treatment Session: Pt progressing well today. Pt has improved with his safety awareness, although still is impulsive and wants to complete all mobility independently. Pt still requires mod assist at times to maintain stability and prevent left listing. Will continue per plan, focusing on balance functionally and coordination. PT Plan: Discharge plan remains appropriate;Frequency remains appropriate PT Frequency: Min 4X/week Recommendations for Other Services: Rehab consult Follow Up Recommendations: Home health PT;Supervision/Assistance - 24 hour;Inpatient Rehab (CIR at this time) Equipment Recommended: Rolling walker with 5" wheels PT Goals  Acute Rehab PT Goals PT Goal Formulation: With patient PT Goal: Sit to Stand - Progress: Progressing toward goal PT Goal: Stand to Sit - Progress: Progressing toward goal PT Transfer Goal: Bed to Chair/Chair to Bed - Progress: Progressing toward goal PT Goal: Ambulate - Progress: Progressing toward goal PT Goal: Up/Down Stairs - Progress: Met  PT Treatment Precautions/Restrictions  Precautions Precautions: Fall Restrictions Weight Bearing Restrictions: No Mobility (including Balance) Bed Mobility Bed Mobility: No Transfers Transfers: Yes Sit to Stand: 4: Min assist;With upper extremity assist;From chair/3-in-1 Sit to Stand Details (indicate cue type and reason): Min assist secondary to safety with left list upon standing. Pt able to complete some stands with only close supervision Stand to Sit: 5: Supervision;With armrests;To chair/3-in-1 Stand to Sit Details: VC for hand placement Ambulation/Gait Ambulation/Gait: Yes Ambulation/Gait Assistance: 4: Min assist;3: Mod assist Ambulation/Gait Assistance Details (indicate cue type and reason): Min assist for majority of  ambulation, although pt had numerous times when he was unstable with a left list and required mod assist to maintain stability and upright positioning.  Ambulation Distance (Feet): 200 Feet Assistive device: Rolling walker Gait Pattern: Step-to pattern;Lateral trunk lean to left;Decreased stride length (decreased steppage) Gait velocity: decreased gait speed Stairs: Yes Stairs Assistance: 4: Min assist Stairs Assistance Details (indicate cue type and reason): VC for proper stair sequencing. Pt attempted stairs leading with both LEs. Pt was more stable using RLE leading although he was able to maintain stability with only min assist leading with the left. Stair Management Technique: Backwards;No rails;With walker Number of Stairs: 2  (3 trials)  Static Standing Balance Static Standing - Balance Support: No upper extremity supported Static Standing - Level of Assistance: 5: Stand by assistance Static Standing - Comment/# of Minutes: 2. Pt able to maintain standing posture statically without any assist. Exercise    End of Session PT - End of Session Equipment Utilized During Treatment: Gait belt Activity Tolerance: Patient tolerated treatment well Patient left: in chair;with call bell in reach;with family/visitor present Nurse Communication: Mobility status for transfers;Mobility status for ambulation General Behavior During Session: [facility] for tasks performed Cognition: [facility] for tasks performed  Milana Kidney 11/15/2011, 11:52 AM  11/15/2011 Milana Kidney DPT PAGER: [phone] OFFICE: [id number]

## 2011-11-15 NOTE — Progress Notes (Signed)
Stroke Team Progress Note  HISTORY Eric Craig is an 51 y.o. male PmHx COPD, visual defect, congenital anomaly of cerebrovascular system, diplopia, HTN, HA, epilepsy who is followed out patient by Dr. Sandria Manly of [facility]. Patient went to sleep last night at 12 and noted at that time to have difficulty swallowing his own secretions and broke out in " a cold sweat" just before going to sleep. He awoke at 6:15 this am and felt he could not keep his balance. He kept to listing to the left and wife noted a new left facial droop, left eye ptosis and patient noted left facial numbness. 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 Level of Consciousness (1a. ): Alert, keenly responsive (02/02 0820) LOC Questions (1b. ): Answers both questions correctly (02/02 0820) LOC Commands (1c. ): Performs both tasks correctly (02/02 0820) Best Gaze (2. ): Normal (02/02 0820) Visual (3. ): Partial hemianopia (02/02 0820) Facial Palsy (4. ): Normal symmetrical movements (02/02 0820) Motor Arm, Left (5a. ): No drift (02/02 0820) Motor Arm, Right (5b. ): No drift (02/02 0820) Motor Leg, Left (6a. ): No drift (02/02 0820) Motor Leg, Right (6b. ): No drift (02/02 0820) Limb Ataxia (7. ): Absent (02/02 0820) Sensory (8. ): Mild-to-moderate sensory loss, patient feels pinprick is less sharp or is dull on the affected side, or there is a loss of superficial pain with pinprick, but patient is aware of being touched (02/02 0820) Best Language (9. ): No aphasia (02/02 0820) Dysarthria (10. ): Normal (02/02 0820) Inattention/Extinction: No Abnormality (02/02 0820) Total: 2  (02/02 0820)  SUBJECTIVE Patient continues to have difficulty with sitting and standing balance.  Ambulated twice yesterday, wants more opportunities to get up.    OBJECTIVE Filed Vitals:   11/14/11 1950 11/14/11 2316 11/15/11 0200 11/15/11 0707  BP: 145/84 143/76 151/81 132/89  Pulse: 69 69  79 65  Temp: 98.6 F (37 C) 97.4 F (36.3 C) 98.2 F (36.8 C) 98.5 F (36.9 C)  TempSrc: Oral Oral Oral Oral  Resp: 18 18 20 18   Height:      Weight:      SpO2: 95% 96% 95% 95%    Basename 11/13/11 0739  GLUCAP 104*   Intake/Output from previous day: 02/02 0701 - 02/03 0700 In: 600 [P.O.:600] Out: 400 [Urine:400]  IV Fluid Intake:    Medications     . aspirin  325 mg Oral Daily  . carbamazepine  200 mg Oral Daily  . carbamazepine  300 mg Oral QHS  . enoxaparin  40 mg Subcutaneous Q24H  . ibuprofen  400 mg Oral Once  . pantoprazole  40 mg Oral Q1200  . phenytoin  100 mg Oral QHS  . phenytoin  200 mg Oral Daily  . sodium chloride  3 mL Intravenous Q12H  . DISCONTD: aspirin  300 mg Rectal Daily  PRN sodium chloride, diphenhydrAMINE, gadobenate dimeglumine, ondansetron (ZOFRAN) IV, ondansetron, sodium chloride  Diet:  General thin liquids Activity:   Up with assistance DVT Prophylaxis: lovenox  Mental Status: Alert, oriented, thought content appropriate.  Speech fluent without evidence of aphasia. Able to follow 3 step commands without difficulty. Cranial Nerves: II- Loss VA R lower quadrant (baseline) III/IV/VI-Extraocular movements intact.  Pupils reactive bilaterally. V/VII-Smile asymmetric with flattening L NLF, however improved from yesterday. VIII-grossly intact IX/X-normal gag XI-bilateral shoulder shrug XII-midline tongue extension Motor: 5/5 bilaterally with normal tone and bulk  Sensory: Pinprick and light touch intact throughout, bilaterally Deep Tendon Reflexes: 2+ and symmetric throughout Plantars: Downgoing bilaterally Cerebellar: Mild dysmetria L U/LE. Normal FN-HS on R.  Slower RAM on L and orbits Left hand.   Significant Diagnostic Studies: CBC    Component Value Date/Time   WBC 3.9* 11/13/2011 0747   RBC 4.26 11/13/2011 0747   HGB 14.6 11/13/2011 0747   HCT 41.2 11/13/2011 0747   PLT 175 11/13/2011 0747   MCV 96.7 11/13/2011 0747   MCH 34.3*  11/13/2011 0747   MCHC 35.4 11/13/2011 0747   RDW 12.9 11/13/2011 0747   LYMPHSABS 1.1 11/13/2011 0747   MONOABS 0.3 11/13/2011 0747   EOSABS 0.1 11/13/2011 0747   BASOSABS 0.0 11/13/2011 0747   CMP    Component Value Date/Time   NA 141 11/13/2011 0747   K 3.5 11/13/2011 0747   CL 106 11/13/2011 0747   CO2 24 11/13/2011 0747   GLUCOSE 91 11/13/2011 0747   BUN 13 11/13/2011 0747   CREATININE 0.88 11/13/2011 0747   CALCIUM 8.8 11/13/2011 0747   PROT 6.7 11/13/2011 0747   ALBUMIN 3.5 11/13/2011 0747   AST 15 11/13/2011 0747   ALT 15 11/13/2011 0747   ALKPHOS 107 11/13/2011 0747   BILITOT 0.4 11/13/2011 0747   GFRNONAA >90 11/13/2011 0747   GFRAA >90 11/13/2011 0747   COAGS Lab Results  Component Value Date   INR 0.98 11/13/2011   Lipid Panel    Component Value Date/Time   CHOL 183 11/14/2011 0008   TRIG 211* 11/14/2011 0008   HDL 50 11/14/2011 0008   CHOLHDL 3.7 11/14/2011 0008   VLDL 42* 11/14/2011 0008   LDLCALC 91 11/14/2011 0008    Phenytion level 11/13/11 5.0 (L)   11/13/2011    CT HEAD WITHOUT CONTRAST    Comparison: CTA head 01/10/2010.  Findings: An aneurysm clip at the basilar tip is stable.  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  There is a remote infarct of the left thalamus.  Minimal white matter disease is evident.  No acute infarct, hemorrhage, or mass lesion is present.  Remote encephalomalacia of the left parietal and occipital lobe is stable.  The patient is status post left parietal craniotomy and right temporal craniotomy as before.  The paranasal sinuses and mastoid air cells are clear.  IMPRESSION:  1.  No acute intracranial abnormality or significant interval change. 2.  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 3.  Stable appearance of multiple aneurysm clips. 4.  Remote lacunar infarct of the left thalamus.  CHRISTOPHER W. MATTERN, M.D.   11/13/2011    MRI HEAD WITHOUT CONTRAST    Findings: There is some artifact from the aneurysm clips both at the basilar tip and within the left occipital and parietal lobe. The diffusion weighted images demonstrate no acute or subacute infarction. No hemorrhage or mass lesion is present. Encephalomalacia of the left parietal and occipital lobe is stable.  The ventricles are of normal size.  No significant extra-axial fluid collection is present.  Flow is present in the major intracranial arteries. There is scattered opacification of ethmoid air cells.  Minimal mucosal thickening is evident within the anterior sphenoid sinuses and at the floor of the maxillary sinuses bilaterally.  IMPRESSION:  1.  No acute intracranial abnormality. 2. Postoperative changes of bilateral craniotomies with multiple aneurysm clips as described. 3.  Stable encephalomalacia in the left occipital and parietal lobe. 4.  Sinus disease. CHRISTOPHER W. MATTERN, M.D.   11/14/11 MRA HEAD WITHOUT CONTRAST IMPRESSION:  1. Occluded or extremely slow flow within the distal left vertebral artery.  2. Moderate small vessel disease bilaterally.  3. Signal loss of the basilar tip and within the cavernous right internal carotid artery due to artifact from an aneurysm clip.   CHRISTOPHER W. MATTERN, M.D.  11/14/11 MRA NECK WITHOUT AND WITH CONTRAST  IMPRESSION:  1. 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  2. 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  3. No other significant stenoses within the cervical vasculature.   CHRISTOPHER W. MATTERN, M.D.  2D ECHO 11/13/11: - Left ventricle: The cavity size was normal. Wall thickness was normal.     Systolic function was normal. The estimated ejection fraction was in the     range of 60% to 65%. Doppler parameters are consistent with abnormal     left ventricular relaxation (grade 1 diastolic dysfunction).    - Right atrium: The atrium was  mildly dilated.    - Pulmonary arteries: PA peak pressure: 32mm Hg (S).  ECG 11/13/11- (prelim) SR, poor R wave progression, R atrial abnormality.  No ischemia.  Carotid Dopplers 11/13/11 (Prelim) No significant ICA stenosis bilaterally.           Right vertebral flow is antegrade.           Left vertebral flow is atypical (A To and Fro waveform).   Therapies: ST BSSE: Indicates oropharyngeal dysphagia with +s/s of aspiration during and after the swallow of thin liquid secondary to patient taking too large of sips and talking during the swallow. Strategies of small bites/small sips, swallow x2 with solids, and using effortful swallow effective in decreasing outward s/s of aspiration. Recommend full supervion with all meals secondary to decreased safety awareness to cue patient to complete necessary precautions. Recommend objective evaluation of MBS to assess risk of aspiration secondary to s/s present at BSE, PMH, and due to patient reports of "food , (especially breads), sticking ". ST to follow in acute setting for diet tolerance.  PT: Pt with decreased sitting and standing balance, decreased coordination and left listing during ambulation.  Rec HH with 24/7 vs CIR pending progress.  ASSESSMENT Eric Craig is a 51 y.o. male with 1. New L sided weakness and balance disturbance.  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 2. R visual field deficit chronic.  Diploplia not present on exam. 3. Epilepsy- phenytoin level low.  Consider EEG.  Stroke risk factors: vascular disease. . The patient was not on aspirin PTA.  Hospital day # 2  TREATMENT/PLAN 1. Continue EC ASA 81 mg daily for secondary stroke prevention. 2. MBS recommended by ST to assess aspiration risk. 3. Consider CIR consult based on therapy recommendations.  Marya Fossa PA-C [facility] [phone] 11/15/11 0730  Lesly Dukes

## 2011-11-15 NOTE — Procedures (Signed)
Modified Barium Swallow Procedure Note Patient Details  Name: Eric Craig MRN: [id number] Date of Birth: 1961-05-26  Today's Date: 11/15/2011 Time:  -     Past Medical History:  Past Medical History  Diagnosis Date  . 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 Seizures     Last seizure was 2001   Past Surgical History:  Past Surgical History  Procedure Date  . Neck surgery     C4-5 repair in 1989. C6-7 repair in 1993 and 2001. Residual left arm weakness  . Brain surgery     Basilar artery aneurysm repair in 1997, with facial reconstruction in 1998   HPI:  51 y/o  male with PMH of COPD, visual defect, congenital anomaly of cerebrovascular system, diplopia, HTN, HA,  s/p left parietal craniotomy and right temporal craniotomey, s/p left lacunar infarct of left thalamus. and epilepsy admitted to ED on 11-13-11 with on going dizziness.  Inital BSE completed on 11-14-11  indicating dysphagia  with recommendations to proceed with MBS to assess risk for aspiration .     Recommendation/Prognosis  Clinical Impression Dysphagia Diagnosis: Moderate pharyngeal phase dysphagia;Mild pharyngeal phase dysphagia Clinical impression: Moderate sensory and  anatomical oropharyngeal dysphagia . Patient with hardware at C-4 to C-5 s/p ACDF (1989) impinging posteior pharyngeal wall causing bolus obstruction above level of C-4 with trial of mechanical soft resulting in flash penetration during the swallow. First trial of regular solid patient initiated swallow at level of valleculae but immediately expectorated bolus back to oral cavity.  No other occurences  of penetration or aspiration .  Question cricopharyngeal dysfunction secondary to residual material remaining in pyriform recess  s/p swallow of all  PO trials including thin liquids increasing risk for aspiration after the swallow.  Strategies effective in clearing residuals are swallow x 2 paired with chin  tuck.  Recommend full supervision with all meals to cue patient to take small bites/sips and eat at slow rate as aspiration risk remains high without strategies.  Swallow Evaluation Recommendations Solid Consistency: Regular Liquid Consistency: Thin Medication Administration: Whole meds with liquid Compensations: Slow rate;Small sips/bites;Multiple dry swallows after each bite/sip;Effortful swallow;Clear throat intermittently Postural Changes and/or Swallow Maneuvers: Out of bed for meals;Seated upright 90 degrees;Upright 30-60 min after meal;Chin tuck Oral Care Recommendations: Oral care BID Other Recommendations: Clarify dietary restrictions;Other (Comment) (avoid breads) Recommendations for Other Services: Rehab consult Follow up Recommendations: Inpatient Rehab   Individuals Consulted Consulted and Agree with Results and Recommendations: Patient;RN  SLP Assessment/Plan Speech Therapy Frequency: min 2x/week Potential to Achieve Goals: Good Potential Considerations: Cooperation/participation level  SLP Goals  SLP Swallowing Goals Patient will consume recommended diet without observed clinical signs of aspiration with: Moderate assistance Patient will utilize recommended strategies during swallow to increase swallowing safety with: Moderate assistance  General:  Date of Onset: 11/13/11 HPI: 51 y/o  male with PMH of COPD, visual defect, congenital anomaly of cerebrovascular system, diplopia, HTN, HA,  s/p left parietal craniotomy and right temporal craniotomey, s/p left lacunar infarct of left thalamus, and epilepsy admitted to ED on 11-13-11 with on going dizziness.  Inital BSE completed on 11-14-11  indicates dysphagia  with recommendations to proceed with MBS to assess risk for aspiration . Type of Study: Initial MBS Diet Prior to this Study: Regular;Thin liquids Temperature Spikes Noted: No Respiratory Status: Room air History of Intubation: No Behavior/Cognition:  Alert;Impulsive;Distractible;Requires cueing Oral Cavity - Dentition: Adequate natural dentition Oral Motor /  Sensory Function: Impaired motor;Impaired sensory Oral impairment: Left facial;Left labial Vision: Impaired for self-feeding Patient Positioning: Upright in chair Baseline Vocal Quality: Clear Volitional Cough: Strong Volitional Swallow: Able to elicit Anatomy: Within functional limits Pharyngeal Secretions: Not observed secondary MBS   Oral Phase Oral Preparation/Oral Phase Oral Phase: Impaired Oral - Solids Oral - Mechanical Soft: Piecemeal swallowing Oral - Regular: Piecemeal swallowing Oral Phase - Comment Oral Phase - Comment: patient observed to attempt to swallow trials before completing oral prep stage Pharyngeal Phase  Pharyngeal Phase Pharyngeal Phase: Impaired Pharyngeal - Thin Pharyngeal - Thin Straw: Pharyngeal residue - valleculae;Pharyngeal residue - pyriform;Pharyngeal residue - cp segment Pharyngeal - Solids Pharyngeal - Puree: Pharyngeal residue - valleculae;Pharyngeal residue - cp segment;Pharyngeal residue - pyriform Pharyngeal - Mechanical Soft: Penetration/Aspiration during swallow Penetration/Aspiration details (mechanical soft): Material enters airway, remains ABOVE vocal cords then ejected out Pharyngeal - Regular: Pharyngeal residue - valleculae;Lateral channel residue;Pharyngeal residue - cp segment Pharyngeal - Pill: Within functional limits Pharyngeal Phase - Comment Pharyngeal Comment: One occurrence of flash penetration with mechanical soft. Patient with hardware on C-4 to C-5 from previous ACDF impinging posterior wall . Bolus "hung up" in pharynx at C-4 but cleared with second swallow. Stratgies effective to clear residue are swallow x2 paired with chin tuck.  Cervical Esophageal Phase  Cervical Esophageal Phase Cervical Esophageal Phase: [facility]  Moreen Fowler M.S., CCC-SLP [phone]   [facility] 11/15/2011, 1:41 PM

## 2011-11-15 NOTE — Progress Notes (Signed)
Subjective:  Patient is tolerating solid food without vomiting although speech therapy has recommended MBS to further evaluate oropharyngeal dysphagia. Patient does not complain of chest pain or shortness of breath. He has occasional headaches which is resolved with ibuprofen.   Objective: Vital signs in last 24 hours: Filed Vitals:   11/14/11 1950 11/14/11 2316 11/15/11 0200 11/15/11 0707  BP: 145/84 143/76 151/81 132/89  Pulse: 69 69 79 65  Temp: 98.6 F (37 C) 97.4 F (36.3 C) 98.2 F (36.8 C) 98.5 F (36.9 C)  TempSrc: Oral Oral Oral Oral  Resp: 18 18 20 18   Height:      Weight:      SpO2: 95% 96% 95% 95%    Physical Exam: Constitutional: Alert and orientated to person, time,and place.  Head: Well healed scar on forehead. Normocephalic. Atraumatic  Eyes: EOMI. Visual Field deficit in right lower visual fields. Pupils equally round and reactive to light and accomodation. Horizontal nystagmus, most pronounced on rightward gaze.  Neck: No thyroidmegaly. No cervical lymphadenopathy.  Pulmonary: Clear to auscultation bilaterally, No wheezing or rhonchii  Cardiovascular: Regular rate and rhythm. No murmurs, rubs, or gallops. No JVD. 2+ radial and pedal pulses. Capillary refill < 2s.  Gastrointestinal: Bowel sounds present. Soft. Non-tender. No hepatosplenomegaly.  Extremities: No LE edema. Acyanotic  Neuro: CN 2-12 intact. Visual field deficit in right lower visual fields. Normal pinprick and soft-touch to face, arms, hands, and feet bilaterally. 5/5 strength of grip, leg flexion, leg extension, and feet bilaterally. 5/5 strength of right arm flexion and extension. 4/5 strength of left arm flexion and extension. 2+ radial, tricep, and right patellar reflexes. 1+ left patellar reflex. Abnormal finger-nose-finger with left arm. Normal rapid alternating finger movements bilaterally. Patient falls to left upon standing. Eply maneuver does not produce nystagmus.   Studies/Results: Ct Head  Wo Contrast 11/13/2011 IMPRESSION:  1.  No acute intracranial abnormality or significant interval change. 2.  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 3.  Stable appearance of multiple aneurysm clips. 4.  Remote lacunar infarct of the left thalamus.  Original Report Authenticated By: Jamesetta Orleans. MATTERN, M.D.   Mr Brain Wo Contrast 11/13/2011 IMPRESSION:  1.  No acute intracranial abnormality. 2. Postoperative changes of bilateral craniotomies with multiple aneurysm clips as described. 3.  Stable encephalomalacia in the left occipital and parietal lobe. 4.  Sinus disease.  Original Report Authenticated By: Jamesetta Orleans. MATTERN, M.D.   Dg Chest Portable 1 View 11/13/2011 IMPRESSION: Minimal left base atelectasis.  Original Report Authenticated By: ERIC A. MANSELL, M.D.   Transthoracic echocardiogram -11/13/2011 Left ventricle: The cavity size was normal. Wall thickness was normal. Systolic function was normal. The estimated ejection fraction was in the range of 60% to 65%. Doppler parameters are consistent with abnormal left ventricular relaxation (grade 1 diastolic dysfunction). - Right atrium: The atrium was mildly dilated. - Pulmonary arteries: PA peak pressure: 32mm Hg (S).  Carotid Dopplers 11/13/11  No significant ICA stenosis bilaterally.  Right vertebral flow is antegrade.  Left vertebral flow is atypical (A To and Fro waveform).   11/14/11 MRA HEAD WITHOUT CONTRAST  IMPRESSION:  1. Occluded or extremely slow flow within the distal left vertebral artery.  2. Moderate small vessel disease bilaterally.  3. Signal loss of the basilar tip and within the cavernous right internal carotid artery due to artifact from an aneurysm clip.    11/14/11 MRA NECK WITHOUT AND WITH CONTRAST  IMPRESSION:  1. 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  2. Signal loss distally in the left vertebral artery is compatible  with a chronic occlusion.  3. No other significant stenoses within the cervical vasculature.      Therapies:  ST BSSE: Indicates oropharyngeal dysphagia with +s/s of aspiration during and after the swallow of thin liquid secondary to patient taking too large of sips and talking during the swallow. Strategies of small bites/small sips, swallow x2 with solids, and using effortful swallow effective in decreasing outward s/s of aspiration. Recommend full supervion with all meals secondary to decreased safety awareness to cue patient to complete necessary precautions. Recommend objective evaluation of MBS to assess risk of aspiration secondary to s/s present at BSE, PMH, and due to patient reports of "food , (especially breads), sticking ". ST to follow in acute setting for diet tolerance.   PT: Pt with decreased sitting and standing balance, decreased coordination and left listing during ambulation. Rec HH with 24/7 vs CIR pending progress.   Medications: I have reviewed the patient's current medications. Scheduled Meds:    . aspirin      . aspirin EC  81 mg Oral Daily  . carbamazepine  200 mg Oral Daily  . carbamazepine  300 mg Oral QHS  . enoxaparin  40 mg Subcutaneous Q24H  . ibuprofen  400 mg Oral Once  . pantoprazole  40 mg Oral Q1200  . phenytoin  100 mg Oral QHS  . phenytoin  200 mg Oral Daily  . phenytoin  200 mg Oral QHS  . sodium chloride  3 mL Intravenous Q12H  . DISCONTD: aspirin  300 mg Rectal Daily  . DISCONTD: aspirin  325 mg Oral Daily   Continuous Infusions:  PRN Meds:.sodium chloride, diphenhydrAMINE, gadobenate dimeglumine, ibuprofen, ondansetron (ZOFRAN) IV, ondansetron, sodium chloride   Assessment/Plan:  Mr. Obryant is a 51 year old man with a PMH significant for left parietal AVM, basilar artery aneurysm clips, and seizures who presents with symptoms consistent with a cerebellar cerebrovascular accident or vestibular dysfunction. He is currently is stable  condition.    1. Dizziness: Patient's symptoms are consistent with a cerebellar dysfunction. CVA work-up has not yielded an etiology at this time. TTE to be planned tomorrow. Goal is secondary prevention with aspirin and minimizing the modifiable risk factors while maximizing the functional capacity. - npo after midnight for possible TEE - aspirin 81 daily - CIR consult today - MBS to follow up oropharyngeal dysfunction - will d/c telemetry today to give freedom to move around  And also patient has not had any episodes of tachy or brady since admission  2. Seizure: The patient has not experienced any seizures. We are maintaining his outpatient regimen.   -- Carbamazepine 200mg  po qAM and 300mg  po qPM  -- Phenytoin 200mg  po qAM and 100mg  po qPM   3. Hypertension: The patient has a goal MAP 120-130, given his CVA potential. He is not hypertensive at baseline. -- Monitor BP   4. Arteriovenous Malformation: Longstanding. Likely responsible for patient's right lower quadrant visual field defects. Stable on CT.   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   6. Prophylaxis: Enoxaparin  7. Headache: Will start ibuprofen prn  LOS: 1 day  Gowri Suchan 11/15/2011, 10:24 AM

## 2011-11-16 DIAGNOSIS — I6330 Cerebral infarction due to thrombosis of unspecified cerebral artery: Secondary | ICD-10-CM

## 2011-11-16 MED ORDER — ASPIRIN 81 MG PO TBEC: 81.0000 mg | DELAYED_RELEASE_TABLET | Freq: Every day | ORAL | Status: DC

## 2011-11-16 NOTE — Evaluation (Signed)
Speech Language Pathology Evaluation Patient Details Name: Eric Craig MRN: [id number] DOB: 1960/11/14 Today's Date: 11/16/2011  Problem List:  Patient Active Problem List  Diagnoses  . Stroke  . Seizure  . Hypertension   Past Medical History:  Past Medical History  Diagnosis Date  . 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 Seizures     Last seizure was 2001   Past Surgical History:  Past Surgical History  Procedure Date  . Neck surgery     C4-5 repair in 1989. C6-7 repair in 1993 and 2001. Residual left arm weakness  . Brain surgery     Basilar artery aneurysm repair in 1997, with facial reconstruction in 1998    SLP Assessment/Plan/Recommendation Assessment Clinical Impression Statement: Patient exhibits normal speech/language/cognitive function at this time.  Patient and family agree.  Patient is tolerating a regular diet with thin liquids without difficulty.  No follow-up ST is indicated at this time. SLP Recommendation/Assessment: Patient does not need any further Speech Lanaguage Pathology Services No Skilled Speech Therapy: All education completed;Patient at baseline level of functioning;Patient will have necessary level of assist by caregiver at discharge Plan Speech Therapy Frequency: min 2x/week Treatment/Interventions: SLP instruction and feedback;Patient/family education Potential to Achieve Goals: Good Potential Considerations: Cooperation/participation level SLP Recommendations Recommendations for Other Services: Rehab consult Follow up Recommendations: Inpatient Rehab Equipment Recommended: None recommended by SLP Individuals Consulted Consulted and Agree with Results and Recommendations: Patient;Family member/caregiver Family Member Consulted : Wife and daughter  SLP Goals  SLP Goals Potential to Achieve Goals: Good Potential Considerations: Cooperation/participation level  SLP Evaluation Prior  Functioning Cognitive/Linguistic Baseline: Within functional limits Type of Home: House Lives With: Spouse Receives Help From: Family 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[employer])  Cognition Overall Cognitive Status: Appears within functional limits for tasks assessed Arousal/Alertness: Awake/alert Orientation Level: Oriented X4 Divided Attention: Appears intact Memory: Appears intact Awareness: Appears intact Problem Solving: Appears intact Safety/Judgment: Appears intact  Comprehension Auditory Comprehension Overall Auditory Comprehension: Appears within functional limits for tasks assessed Visual Recognition/Discrimination Discrimination: Within Function Limits Reading Comprehension Reading Status: Not tested  Expression Expression Primary Mode of Expression: Verbal Verbal Expression Overall Verbal Expression: Appears within functional limits for tasks assessed Initiation: No impairment Level of Generative/Spontaneous Verbalization: Conversation Repetition: No impairment Naming: No impairment Pragmatics: No impairment Written Expression Written Expression: Not tested  Oral/Motor Oral Motor/Sensory Function Overall Oral Motor/Sensory Function: Appears within functional limits for tasks assessed Motor Speech Overall Motor Speech: Appears within functional limits for tasks assessed  Maryjo Rochester T 11/16/2011, 11:30 AM

## 2011-11-16 NOTE — Consult Note (Signed)
Physical Medicine and Rehabilitation Consult Reason for Consult: Suspect stroke Referring Phsyician: [facility] hospitalist Eric Craig is an 51 y.o. male.   HPI: 51 year old right-handed male with history of left parietal AVM with craniotomy in 1997, seizure disorder. Admitted January 2 with dizziness and left-sided weakness. MRI of the brain showed no acute intracranial changes noted postoperative changes of bilateral craniotomies and multiple aneurysm clippings. MRA of the head with occluded or extremely slow flow within the distal left vertebral artery. MRA of the neck with signal loss distally in the left for tube or artery compatible with chronic occlusion. Echocardiogram with ejection fraction 65% grade 1 diastolic dysfunction. Carotid Dopplers with no significant internal carotid artery stenosis. 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 Plan TEE as well as EEG. Presently maintained on aspirin therapy as well as subcutaneous Lovenox. Patient remains on Dilantin and Tegretol as prior to admission for history of seizure disorder. MBS on 2/3 presently maintained on regular diet. Physical medicine and rehabilitation was consulted to consider inpatient rehabilitation services patient still noted to be impulsive requiring minimum to moderate assist for ambulation  Review of Systems  Constitutional: Positive for malaise/fatigue.  Eyes: Positive for double vision.  Respiratory: Negative for cough and shortness of breath.   Cardiovascular: Negative for chest pain.  Gastrointestinal: Positive for constipation. Negative for nausea.  Musculoskeletal: Negative for myalgias.  Neurological: Positive for dizziness and seizures. Negative for headaches.  Psychiatric/Behavioral: Positive for memory loss.   Past Medical History  Diagnosis Date  . 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 Seizures     Last seizure was 2001   Past Surgical History  Procedure  Date  . Neck surgery     C4-5 repair in 1989. C6-7 repair in 1993 and 2001. Residual left arm weakness  . Brain surgery     Basilar artery aneurysm repair in 1997, with facial reconstruction in 1998   Family History  Problem Relation Age of Onset  . Aortic aneurysm Mother 12  . Hypertension Father    Social History:  reports that he has been smoking Cigarettes.  He has a 40 pack-year smoking history. He does not have any smokeless tobacco history on file. He reports that he drinks alcohol. He reports that he does not use illicit drugs. Allergies: No Known Allergies Medications Prior to Admission  Medication Dose Route Frequency Provider Last Rate Last Dose  . 0.9 %  sodium chloride infusion  250 mL Intravenous PRN Lars Mage, MD      . aspirin 81 MG chewable tablet        81 mg at 11/15/11 0919  . aspirin EC tablet 81 mg  81 mg Oral Daily Lars Mage, MD      . carbamazepine (TEGRETOL XR) 12 hr tablet 200 mg  200 mg Oral Daily Lars Mage, MD   200 mg at 11/15/11 9147  . carbamazepine (TEGRETOL XR) 12 hr tablet 300 mg  300 mg Oral QHS Lars Mage, MD   300 mg at 11/15/11 2148  . diphenhydrAMINE (BENADRYL) capsule 25 mg  25 mg Oral QHS PRN Lars Mage, MD   25 mg at 11/15/11 2257  . enoxaparin (LOVENOX) injection 40 mg  40 mg Subcutaneous Q24H Lars Mage, MD   40 mg at 11/15/11 1744  . gadobenate dimeglumine (MULTIHANCE) injection 18 mL  18 mL Intravenous Once PRN Medication Radiologist, MD   18 mL at 11/14/11 1352  . ibuprofen (ADVIL,MOTRIN) tablet  200 mg  200 mg Oral Q6H PRN Lars Mage, MD   200 mg at 11/15/11 1610  . ibuprofen (ADVIL,MOTRIN) tablet 400 mg  400 mg Oral Once Vernice Jefferson, MD   400 mg at 11/15/11 0706  . ibuprofen (ADVIL,MOTRIN) tablet 600 mg  600 mg Oral Once Na Li, MD   600 mg at 11/14/11 0037  . ondansetron (ZOFRAN) 4 MG/2ML injection           . ondansetron (ZOFRAN) injection 4 mg  4 mg Intravenous Once Carleene Cooper III, MD   4 mg at 11/13/11 0809  . ondansetron (ZOFRAN)  tablet 4 mg  4 mg Oral Q6H PRN Lars Mage, MD   4 mg at 11/13/11 1635   Or  . ondansetron (ZOFRAN) injection 4 mg  4 mg Intravenous Q6H PRN Lars Mage, MD      . pantoprazole (PROTONIX) EC tablet 40 mg  40 mg Oral Q1200 Kari Baars, MD   40 mg at 11/15/11 1610  . phenytoin (DILANTIN) ER capsule 100 mg  100 mg Oral QHS Lars Mage, MD   100 mg at 11/15/11 2145  . phenytoin (DILANTIN) ER capsule 200 mg  200 mg Oral Daily Lars Mage, MD   200 mg at 11/15/11 0924  . phenytoin (DILANTIN) ER capsule 200 mg  200 mg Oral QHS Tara C Jernejcic, PA   200 mg at 11/15/11 2144  . sodium chloride 0.9 % injection 3 mL  3 mL Intravenous Q12H Lars Mage, MD   3 mL at 11/15/11 2149  . sodium chloride 0.9 % injection 3 mL  3 mL Intravenous PRN Lars Mage, MD      . DISCONTD: aspirin suppository 300 mg  300 mg Rectal Daily Vania Rea. [place], [place]      . DISCONTD: aspirin tablet 325 mg  325 mg Oral Daily Julian Hy, MD   325 mg at 11/14/11 1402  . DISCONTD: carbamazepine (CARBATROL) 12 hr capsule 200-300 mg  200-300 mg Oral BID Lars Mage, MD      . DISCONTD: phenytoin (DILANTIN) ER capsule 100-200 mg  100-200 mg Oral BID Lars Mage, MD       No current outpatient prescriptions on file as of 11/16/2011.    Home: Home Living Lives With: Spouse Receives Help From: Family Type of Home: House Home Layout: Two level;Able to live on main level with bedroom/bathroom Alternate Level Stairs-Rails: Left Alternate Level Stairs-Number of Steps: 13 Home Access: Stairs to enter Entrance Stairs-Rails: None Entrance Stairs-Number of Steps: 2 Bathroom Shower/Tub: Walk-in shower;Door [employer] Toilet: Standard Bathroom Accessibility: Yes How Accessible: Accessible via walker Home Adaptive Equipment: None  Functional History: Prior Function Level of Independence: Independent with basic ADLs;Independent with homemaking with ambulation;Independent with gait;Independent with transfers Able to Take Stairs?: Yes Driving:  Yes Vocation: Full time employment Vocation Requirements: electrician Functional Status:  Mobility: Bed Mobility Bed Mobility: No Supine to Sit: 6: Modified independent (Device/Increase time) Transfers Transfers: Yes Sit to Stand: 4: Min assist;With upper extremity assist;From chair/3-in-1 Sit to Stand Details (indicate cue type and reason): Min assist secondary to safety with left list upon standing. Pt able to complete some stands with only close supervision Stand to Sit: 5: Supervision;With armrests;To chair/3-in-1 Stand to Sit Details: VC for hand placement Ambulation/Gait Ambulation/Gait: Yes Ambulation/Gait Assistance: 4: Min assist;3: Mod assist Ambulation/Gait Assistance Details (indicate cue type and reason): Min assist for majority of ambulation, although pt had numerous times when he was unstable with a left list  and required mod assist to maintain stability and upright positioning.  Ambulation Distance (Feet): 200 Feet Assistive device: Rolling walker Gait Pattern: Step-to pattern;Lateral trunk lean to left;Decreased stride length (decreased steppage) Gait velocity: decreased gait speed Stairs: Yes Stairs Assistance: 4: Min assist Stairs Assistance Details (indicate cue type and reason): VC for proper stair sequencing. Pt attempted stairs leading with both LEs. Pt was more stable using RLE leading although he was able to maintain stability with only min assist leading with the left. Stair Management Technique: Backwards;No rails;With walker Number of Stairs: 2  (3 trials)    ADL:    Cognition: Cognition Arousal/Alertness: Awake/alert Orientation Level: Oriented X4 Cognition Arousal/Alertness: Awake/alert Overall Cognitive Status: Appears within functional limits for tasks assessed Orientation Level: Oriented X4  Blood pressure 116/81, pulse 83, temperature 98.5 F (36.9 C), temperature source Oral, resp. rate 18, height 5\' 11"  (1.803 m), weight 75.8 kg (167 lb  1.7 oz), SpO2 95.00%. Physical Exam  Constitutional: He appears well-developed.  HENT:  Head: Normocephalic.  Neck: Normal range of motion. Neck supple. No thyromegaly present.  Cardiovascular: Normal rate and regular rhythm.   Pulmonary/Chest: Breath sounds normal. He has no wheezes.  Abdominal: He exhibits no distension. There is no tenderness.  Musculoskeletal: Normal range of motion.  Neurological: He is alert.       Patient is appropriate. Oriented to Name  person place date of birth. Follow 3 step commands. Conversationally appropriate. Cranial nerve exam grossly intact. No sensory deficit seen on either side. He does have diminished fine motor coordination of the left arm and left leg especially proximally. Strength is 5 out of 5 in both upper extremities. Left lower extremity is 3+ to 4/5 proximal to 5 out of 5 distally. He is 4-5 out of 5 on the right.  Psychiatric:       Needs some subtle cues for safety    No results found for this or any previous visit (from the past 24 hour(s)). [name] Head Wo Contrast  11/14/2011  *RADIOLOGY REPORT*  Clinical Data:  Stroke.  Seizure disorder.  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 Dizziness.  MRA HEAD WITHOUT CONTRAST  Technique:  Angiographic images of the Circle of Willis were obtained using MRA technique without intravenous contrast.  Comparison:  MRI brain 11/13/2011.  Findings:  The internal carotid arteries are within normal limits bilaterally.  There is some loss of signal within the cavernous right internal carotid artery due to the basilar tip aneurysm clip. The A1 and M1 segments are normal.  A small anterior communicating artery is patent.  The MCA bifurcations are within normal limits bilaterally.  There is significant attenuation of distal small vessels bilaterally.  ACA branch vessel attenuation is evident as well.  There is very slow or occluded flow in the distal left vertebral artery.  The  right vertebral artery is the dominant vessel.  The basilar artery is within normal limits.  Signal loss of the basilar tip is secondary to the aneurysm clip placement. The posterior cerebral arteries are not visualized due to the artifact as well.  IMPRESSION:  1.  Occluded or extremely slow flow within the distal left vertebral artery. 2.  Moderate small vessel disease bilaterally. 3.  Signal loss of the basilar tip and within the cavernous right internal carotid artery due to artifact from an aneurysm clip.  *RADIOLOGY REPORT*  MRA NECK WITHOUT AND WITH CONTRAST  Technique:  Angiographic images of the  neck were obtained using MRA technique without and with intravenous contrast.  Carotid stenosis measurements (when applicable) are obtained utilizing NASCET criteria, using the distal internal carotid diameter as the denominator.  Contrast: 18mL MULTIHANCE GADOBENATE DIMEGLUMINE 529 MG/ML IV SOLN  Findings:  There is no significant flow disturbance at either carotid bifurcation.  Flow is antegrade within the right vertebral artery.  The left vertebral artery is not visualized.  The postcontrast images demonstrate a standard three-vessel arch configuration.  A very small left vertebral artery originates from the aortic arch. Signal can be followed to the level of dural margin.  The right vertebral artery originates from the right subclavian without significant proximal stenosis.  Mild tortuosity is present.  There is no focal stenosis.  Signal loss is present at the basilar tip due to the aneurysm clip.  The right common carotid artery is within normal limits. The bifurcation is unremarkable.  The right internal carotid artery is normal.  The left common carotid artery is within normal limits. The bifurcation is unremarkable.  The left internal carotid artery is normal.  IMPRESSION:  1.  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 2.  Signal loss distally in the left vertebral artery is  compatible with a chronic occlusion. 3.  No other significant stenoses within the cervical vasculature.  Original Report Authenticated By: Jamesetta Orleans. MATTERN, M.D.   Mr Angiogram Neck W Wo Contrast  11/14/2011  *RADIOLOGY REPORT*  Clinical Data:  Stroke.  Seizure disorder.  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 Dizziness.  MRA HEAD WITHOUT CONTRAST  Technique:  Angiographic images of the Circle of Willis were obtained using MRA technique without intravenous contrast.  Comparison:  MRI brain 11/13/2011.  Findings:  The internal carotid arteries are within normal limits bilaterally.  There is some loss of signal within the cavernous right internal carotid artery due to the basilar tip aneurysm clip. The A1 and M1 segments are normal.  A small anterior communicating artery is patent.  The MCA bifurcations are within normal limits bilaterally.  There is significant attenuation of distal small vessels bilaterally.  ACA branch vessel attenuation is evident as well.  There is very slow or occluded flow in the distal left vertebral artery.  The right vertebral artery is the dominant vessel.  The basilar artery is within normal limits.  Signal loss of the basilar tip is secondary to the aneurysm clip placement. The posterior cerebral arteries are not visualized due to the artifact as well.  IMPRESSION:  1.  Occluded or extremely slow flow within the distal left vertebral artery. 2.  Moderate small vessel disease bilaterally. 3.  Signal loss of the basilar tip and within the cavernous right internal carotid artery due to artifact from an aneurysm clip.  *RADIOLOGY REPORT*  MRA NECK WITHOUT AND WITH CONTRAST  Technique:  Angiographic images of the neck were obtained using MRA technique without and with intravenous contrast.  Carotid stenosis measurements (when applicable) are obtained utilizing NASCET criteria, using the distal internal carotid diameter as the denominator.   Contrast: 18mL MULTIHANCE GADOBENATE DIMEGLUMINE 529 MG/ML IV SOLN  Findings:  There is no significant flow disturbance at either carotid bifurcation.  Flow is antegrade within the right vertebral artery.  The left vertebral artery is not visualized.  The postcontrast images demonstrate a standard three-vessel arch configuration.  A very small left vertebral artery originates from the aortic arch. Signal can be followed to the level of  dural margin.  The right vertebral artery originates from the right subclavian without significant proximal stenosis.  Mild tortuosity is present.  There is no focal stenosis.  Signal loss is present at the basilar tip due to the aneurysm clip.  The right common carotid artery is within normal limits. The bifurcation is unremarkable.  The right internal carotid artery is normal.  The left common carotid artery is within normal limits. The bifurcation is unremarkable.  The left internal carotid artery is normal.  IMPRESSION:  1.  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 2.  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 3.  No other significant stenoses within the cervical vasculature.  Original Report Authenticated By: Jamesetta Orleans. MATTERN, M.D.   Dg Swallowing Func-no Report  11/15/2011  CLINICAL DATA: as recommended by sppech therapy for complete evaluation of  swallowing dysfunction   FLUOROSCOPY FOR SWALLOWING FUNCTION STUDY:  Fluoroscopy was provided for swallowing function study, which was  administered by a speech pathologist.  Final results and recommendations  from this study are contained within the speech pathology report.      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 Does the need for close, 24 hr/day medical supervision in concert with the patient's rehab needs make it unreasonable for this patient to be served in a less intensive setting? No and  Potentially 2. Co-Morbidities requiring supervision/potential complications: History of seizure and AVMs 3. Due to bladder management and safety, does the patient require 24 hr/day rehab nursing? No and Potentially 4. Does the patient require coordinated care of a physician, rehab nurse, PT, OT to address physical and functional deficits in the context of the above medical diagnosis(es)? No Addressing deficits in the following areas: balance and locomotion 5. Can the patient actively participate in an intensive therapy program of at least 3 hrs of therapy per day at least 5 days per week? Yes 6. The potential for patient to make measurable gains while on inpatient rehab is fair 7. Anticipated functional outcomes upon discharge from inpatients are not applicable 8. Estimated rehab length of stay to reach the above functional goals is: Not applicable 9. Does the patient have adequate social supports to accommodate these discharge functional goals? Yes 10. Anticipated D/C setting: Home 11. Anticipated post D/C treatments: Outpt therapy 12. Overall Rehab/Functional Prognosis: excellent  RECOMMENDATIONS: This patient's condition is appropriate for continued rehabilitative care in the following setting: Outpt Patient has agreed to participate in recommended program. Yes and Potentially Note that insurance prior authorization may be required for reimbursement for recommended care.  Comment: I would like to see his performance with PT today. Howeve,r based on my examination today and prior therapy evals, I believe he can go home given his social support system and pursue outpatient therapies   Ivory Broad, MD 11/16/2011

## 2011-11-16 NOTE — Discharge Planning (Signed)
Internal Medicine Teaching Service Attending Note Date: 11/16/2011  Patient name: Eric Craig  Medical record number: [id number]  Date of birth: 11-17-1960    This patient has been seen and discussed with the house staff. Please see their note for complete details. 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 He is still somewhat symptomatic from his stroke. Will recommend that he get's vestibular PT in order to improve his gait  Sofi Bryars 11/16/2011, 2:29 PM

## 2011-11-16 NOTE — Progress Notes (Signed)
PT Cancellation Note  Pt dressed and ready to leave when PT arrived. PT discussed questions family and pt had concerning HHPT vs. Outpatient PT. Pt and family have no further questions.   Eric Craig (Beverely Pace) Carleene Mains PT, DPT Acute Rehabilitation [phone]

## 2011-11-16 NOTE — Discharge Planning (Addendum)
Internal Medicine Teaching [facility] Discharge Note  Name: Eric Craig MRN: [id number] DOB: [birth year] 51 y.o.  Date of Admission: 11/13/2011  6:57 AM Date of Discharge: 11/16/2011 Attending Physician: Judyann Munson, MD  Discharge Diagnosis: Cerebrovascular Accident  Discharge Medications: Medication List  As of 11/16/2011 11:34 AM   ASK your doctor about these medications         carbamazepine 100 MG 12 hr capsule   Commonly known as: CARBATROL   Take 200-300 mg by mouth 2 (two) times daily. Take 2 capsules every morning and take 3 capsules every evening      phenytoin 100 MG ER capsule   Commonly known as: DILANTIN   Take 100-200 mg by mouth 2 (two) times daily. Take 2 capsules every morning and take 1 capsule every evening            Disposition and follow-up:   Mr.Eric Craig was discharged from [facility] in Stable condition.    Follow-up Appointments: Follow-up Information    Follow up with ARONSON,RICHARD A, MD .        Consultations: Neurology Following. Outpatient of Dr. Sandria Manly  Procedures Performed:  Ct Head Wo Contrast: 11/13/2011  *RADIOLOGY REPORT*  Clinical Data: Severe dizziness, left-sided facial numbness, and slurred speech.  Remote history of avium resection.  CT HEAD WITHOUT CONTRAST  Technique:  Contiguous axial images were obtained from the base of the skull through the vertex without contrast.  Comparison: CTA head 01/10/2010.  Findings: An aneurysm clip at the basilar tip is stable.  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  There is a remote infarct of the left thalamus.  Minimal white matter disease is evident.  No acute infarct, hemorrhage, or mass lesion is present.  Remote encephalomalacia of the left parietal and occipital lobe is stable.  The patient is status post left parietal craniotomy and right temporal craniotomy as  before.  The paranasal sinuses and mastoid air cells are clear.  IMPRESSION:  1.  No acute intracranial abnormality or significant interval change. 2.  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 3.  Stable appearance of multiple aneurysm clips. 4.  Remote lacunar infarct of the left thalamus.  Original Report Authenticated By: Jamesetta Orleans. MATTERN, M.D.   [name] Head Wo Contrast: 11/14/2011  *RADIOLOGY REPORT*  Clinical Data:  Stroke.  Seizure disorder.  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 Dizziness.  MRA HEAD WITHOUT CONTRAST  Technique:  Angiographic images of the Circle of Willis were obtained using MRA technique without intravenous contrast.  Comparison:  MRI brain 11/13/2011.  Findings:  The internal carotid arteries are within normal limits bilaterally.  There is some loss of signal within the cavernous right internal carotid artery due to the basilar tip aneurysm clip. The A1 and M1 segments are normal.  A small anterior communicating artery is patent.  The MCA bifurcations are within normal limits bilaterally.  There is significant attenuation of distal small vessels bilaterally.  ACA branch vessel attenuation is evident as well.  There is very slow or occluded flow in the distal left vertebral artery.  The right vertebral artery is the dominant vessel.  The basilar artery is within normal limits.  Signal loss of the basilar tip is secondary to the aneurysm clip placement. The posterior cerebral arteries are not visualized due  to the artifact as well.  IMPRESSION:  1.  Occluded or extremely slow flow within the distal left vertebral artery. 2.  Moderate small vessel disease bilaterally. 3.  Signal loss of the basilar tip and within the cavernous right internal carotid artery due to artifact from an aneurysm clip.  *RADIOLOGY REPORT*  MRA NECK WITHOUT AND WITH CONTRAST  Technique:  Angiographic  images of the neck were obtained using MRA technique without and with intravenous contrast.  Carotid stenosis measurements (when applicable) are obtained utilizing NASCET criteria, using the distal internal carotid diameter as the denominator.  Contrast: 18mL MULTIHANCE GADOBENATE DIMEGLUMINE 529 MG/ML IV SOLN  Findings:  There is no significant flow disturbance at either carotid bifurcation.  Flow is antegrade within the right vertebral artery.  The left vertebral artery is not visualized.  The postcontrast images demonstrate a standard three-vessel arch configuration.  A very small left vertebral artery originates from the aortic arch. Signal can be followed to the level of dural margin.  The right vertebral artery originates from the right subclavian without significant proximal stenosis.  Mild tortuosity is present.  There is no focal stenosis.  Signal loss is present at the basilar tip due to the aneurysm clip.  The right common carotid artery is within normal limits. The bifurcation is unremarkable.  The right internal carotid artery is normal.  The left common carotid artery is within normal limits. The bifurcation is unremarkable.  The left internal carotid artery is normal.  IMPRESSION:  1.  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 2.  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 3.  No other significant stenoses within the cervical vasculature.  Original Report Authenticated By: Jamesetta Orleans. MATTERN, M.D.   Mr Angiogram Neck W Wo Contrast 11/14/2011  *RADIOLOGY REPORT*  Clinical Data:  Stroke.  Seizure disorder.  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 Dizziness.  MRA HEAD WITHOUT CONTRAST  Technique:  Angiographic images of the Circle of Willis were obtained using MRA technique without intravenous contrast.  Comparison:  MRI brain 11/13/2011.  Findings:  The internal carotid arteries are  within normal limits bilaterally.  There is some loss of signal within the cavernous right internal carotid artery due to the basilar tip aneurysm clip. The A1 and M1 segments are normal.  A small anterior communicating artery is patent.  The MCA bifurcations are within normal limits bilaterally.  There is significant attenuation of distal small vessels bilaterally.  ACA branch vessel attenuation is evident as well.  There is very slow or occluded flow in the distal left vertebral artery.  The right vertebral artery is the dominant vessel.  The basilar artery is within normal limits.  Signal loss of the basilar tip is secondary to the aneurysm clip placement. The posterior cerebral arteries are not visualized due to the artifact as well.  IMPRESSION:  1.  Occluded or extremely slow flow within the distal left vertebral artery. 2.  Moderate small vessel disease bilaterally. 3.  Signal loss of the basilar tip and within the cavernous right internal carotid artery due to artifact from an aneurysm clip.  *RADIOLOGY REPORT*  MRA NECK WITHOUT AND WITH CONTRAST  Technique:  Angiographic images of the neck were obtained using MRA technique without and with intravenous contrast.  Carotid stenosis measurements (when applicable) are obtained utilizing NASCET criteria, using the distal internal carotid diameter as the denominator.  Contrast: 18mL  MULTIHANCE GADOBENATE DIMEGLUMINE 529 MG/ML IV SOLN  Findings:  There is no significant flow disturbance at either carotid bifurcation.  Flow is antegrade within the right vertebral artery.  The left vertebral artery is not visualized.  The postcontrast images demonstrate a standard three-vessel arch configuration.  A very small left vertebral artery originates from the aortic arch. Signal can be followed to the level of dural margin.  The right vertebral artery originates from the right subclavian without significant proximal stenosis.  Mild tortuosity is present.  There is no focal  stenosis.  Signal loss is present at the basilar tip due to the aneurysm clip.  The right common carotid artery is within normal limits. The bifurcation is unremarkable.  The right internal carotid artery is normal.  The left common carotid artery is within normal limits. The bifurcation is unremarkable.  The left internal carotid artery is normal.  IMPRESSION:  1.  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 2.  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 3.  No other significant stenoses within the cervical vasculature.  Original Report Authenticated By: Jamesetta Orleans. MATTERN, M.D.   Mr Brain Wo Contrast 11/13/2011  *RADIOLOGY REPORT*  Clinical Data: Left-sided facial numbness and dizziness.  Slurred speech.  Status post history of aneurysm clipping and avium resection. The record regarding aneurysm clips for obtained and cleared as safe for MRI.  MRI HEAD WITHOUT CONTRAST  Technique:  Multiplanar, multiecho pulse sequences of the brain and surrounding structures were obtained according to standard protocol without intravenous contrast.  Comparison: CT head without contrast 11/13/2011.  Findings: There is some artifact from the aneurysm clips both at the basilar tip and within the left occipital and parietal lobe. The diffusion weighted images demonstrate no acute or subacute infarction. No hemorrhage or mass lesion is present. Encephalomalacia of the left parietal and occipital lobe is stable.  The ventricles are of normal size.  No significant extra-axial fluid collection is present.  Flow is present in the major intracranial arteries. There is scattered opacification of ethmoid air cells.  Minimal mucosal thickening is evident within the anterior sphenoid sinuses and at the floor of the maxillary sinuses bilaterally.  IMPRESSION:  1.  No acute intracranial abnormality. 2. Postoperative changes of bilateral craniotomies with multiple aneurysm  clips as described. 3.  Stable encephalomalacia in the left occipital and parietal lobe. 4.  Sinus disease.  Original Report Authenticated By: Jamesetta Orleans. MATTERN, M.D.   Dg Chest Portable 1 View 11/13/2011  *RADIOLOGY REPORT*  Clinical Data: Dizziness and vomiting.  PORTABLE CHEST - 1 VIEW  Comparison: No comparison studies available.  Findings: No focal airspace consolidation or pulmonary edema. There is some atelectasis at the left base. The cardiopericardial silhouette is within normal limits for size. Imaged bony structures of the thorax are intact. Telemetry leads overlie the chest.  IMPRESSION: Minimal left base atelectasis.  Original Report Authenticated By: ERIC A. MANSELL, M.D.   Dg Swallowing Func-no 11/15/2011  Clinical Impression  Dysphagia Diagnosis: Moderate pharyngeal phase dysphagia;Mild pharyngeal phase dysphagia  Clinical impression: Moderate sensory and anatomical oropharyngeal dysphagia . Patient with hardware at C-4 to C-5 s/p ACDF (1989) impinging posteior pharyngeal wall causing bolus obstruction above level of C-4 with trial of mechanical soft resulting in flash penetration during the swallow. First trial of regular solid patient initiated swallow at level of valleculae but immediately expectorated bolus back to oral cavity. No other occurences of penetration or aspiration .  Question cricopharyngeal dysfunction secondary to residual material remaining in pyriform recess s/p swallow of all PO trials including thin liquids increasing risk for aspiration after the swallow. Strategies effective in clearing residuals are swallow x 2 paired with chin tuck. Recommend full supervision with all meals to cue patient to take small bites/sips and eat at slow rate as aspiration risk remains high without strategies.  Swallow Evaluation Recommendations  Solid Consistency: Regular  Liquid Consistency: Thin  Carotid Dopplar (11/13/2011): No significant ICA stenosis bilaterally.  Right vertebral  flow is antegrade.  Left vertebral flow is atypical (A To and Fro waveform).   2D Echo:Study Conclusions - Left ventricle: The cavity size was normal. Wall thickness was normal. Systolic function was normal. The estimated ejection fraction was in the range of 60% to 65%. Doppler parameters are consistent with abnormal left ventricular relaxation (grade 1 diastolic dysfunction). - Right atrium: The atrium was mildly dilated. - Pulmonary arteries: PA peak pressure: 32mm Hg (S). Transthoracic echocardiography. M-mode, complete 2D, spectral Doppler, and color Doppler. Height: Height:180.3cm. Height: 71in. Weight: Weight: 77.1kg. Weight:169.6lb. Body mass index: BMI: 23.7kg/m^2. Body surfacearea: BSA: 1.36m^2. Blood pressure: 155/86.  Admission HPI:  Patient is a 51 y.o. 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 Patient was ready to go to bed a day prior to admission when he felt dizzy and hot flashes but ignored it. Upon waking in the morning he had difficulty getting out of bed because of the same dizzy sensation. When he tried to walk he would fall to the left. Patient has the sensation to fall to the left. Patient has weakness in left upper extremity but denies any weakness in left lower extremity. Patient complains of intermittent dizziness over the past month, however the falling sensation and dysphagia are new. Patient complains of a ringing in his ears over the past several weeks, numbness along his left jaw that feels like "being numb at the dentist", headache, and non-bloody vomiting. Patient endorses headache along with his symptoms. Patient denies any chest pain, palpitations, or shortness of breath. Patient says his vision is "normal" and denies diplopia. Patient denies any recent illnesses, fevers, diarrhea or abdominal discomfort.   Hospital Course by problem  list: 1. Dizziness: Patient presented with new onset dizziness associated with left mandible anesthesia, tinnitus, nystagmus, and difficulty with rapid alternating movements. Initial non-contrast CT was negative for any acute brain bleeding. Non-contrasted MRI/MRA of the head and neck did not reveal any pathology consistent with the patient's symptoms, though the MRA did find a hypoplastic left vertebral artery at the aortic arch. The MRI/MRA were somewhat limited by the artifact created by surgical clips used to repair a basilar artery aneurysm. ECG and telemetry did not reveal any dysrrhythmia. 2D echocardiogram did not reveal any mural thrombus or structural cardiac defects. Carotid dopplars did not reveal any carotid stenosis. Patient also presented with some vestibular symptoms with the dizziness, nystagmus, and tinnitus; however, the patient had a negative Epley maneuver and his dizziness does not occur at rest. The patient did not have orthostatic vital signs. 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 The patient was placed on ASA 81mg  upon admission. Per patient, his mandibular numbness and dizziness have improved, though not resolved. The patient had decreased nystagmus and improved finger-nose-finger during his course. Patient was  seen by physical therapy and that his residual neurologic deficits were best managed as an outpatient.   2. Dysphagia: Upon admission the patient complained of difficulty swallowing with subsequent vomiting. During the hospital course the patient's swallowing improved. He was seen by speech therapy who recommenced a solid diet, but with patient education to take small and careful bites. Patient subsequently was able to eat without difficulty.   3. Seizure History:  The patient has not experienced any seizures. Patient's Phenytoin was low at 5.0, and his carbamazepine low at 3.7 upon admission. The  patient reports he likely missed some doses because of vomiting during his presentation. His symptoms could be due to todd's paralysis, but without other seizure evidence, this was not pursued. His outpatient anti-seizure regime was resumed upon   (Carbamazepine 200mg  po qAM and 300mg  po qPM  -- Phenytoin 200mg  po qAM and 100mg  po qPM  was maintained during his hospitalization.  -- Carbamazepine 200mg  po qAM and 300mg  po qPM   4. Hypertension:  Given his presumed CVA his goal MAP was 120-130. Upon admission the patient's blood pressure was 158/90. His blood pressure has gradually decreased since admission to 127/82 on discharge.  5. Arteriovenous Malformation:  Longstanding. Likely responsible for patient's right lower quadrant visual field defects. Stable on CT.   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   7. Prophylaxis: Initially SCDs then enoxaparin.   Discharge Vitals:  BP 127/82  Pulse 75  Temp(Src) 97.5 F (36.4 C) (Oral)  Resp 20  Ht 5\' 11"  (1.803 m)  Wt 75.8 kg (167 lb 1.7 oz)  BMI 23.31 kg/m2  SpO2 99%  Lars Mage MD R3 Internal Medicine Resident Pager [phone]

## 2011-11-16 NOTE — Progress Notes (Signed)
Stroke Team Progress Note  HISTORY Eric Craig is an 51 y.o. male PmHx COPD, visual defect, congenital anomaly of cerebrovascular system, diplopia, HTN, HA, epilepsy who is followed out patient by Dr. Sandria Manly of [facility]. Patient went to sleep 11/13/2011 at 12mn and noted at that time to have difficulty swallowing his own secretions and broke out in " a cold sweat" just before going to sleep. He awoke at 6:15 this am and felt he could not keep his balance. He kept to listing to the left and wife noted a new left facial droop, left eye ptosis and patient noted left facial numbness. 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  SUBJECTIVE Family members at the bedside. No new complaints.   OBJECTIVE Filed Vitals:   11/15/11 2200 11/16/11 0153 11/16/11 0558 11/16/11 1019  BP: 147/85 153/81 116/81 127/82  Pulse: 72 72 83 75  Temp: 98.4 F (36.9 C) 97.7 F (36.5 C) 98.5 F (36.9 C) 97.5 F (36.4 C)  TempSrc: Oral Oral Oral Oral  Resp: 18 18 18 20   Height:      Weight:      SpO2: 95% 94% 95% 99%   No results found for this basename: GLUCAP:3 in the last 72 hours Intake/Output from previous day:   IV Fluid Intake:    Medications    . aspirin EC  81 mg Oral Daily  . carbamazepine  200 mg Oral Daily  . carbamazepine  300 mg Oral QHS  . enoxaparin  40 mg Subcutaneous Q24H  . pantoprazole  40 mg Oral Q1200  . phenytoin  100 mg Oral QHS  . phenytoin  200 mg Oral Daily  . phenytoin  200 mg Oral QHS  . sodium chloride  3 mL Intravenous Q12H  PRN sodium chloride, diphenhydrAMINE, ibuprofen, ondansetron (ZOFRAN) IV, ondansetron, sodium chloride  Diet:  General thin liquids Activity:   Up with assistance DVT Prophylaxis: lovenox  Mental Status: Alert, oriented, thought content appropriate.  Speech fluent without evidence of aphasia. Able to follow 3 step commands without difficulty. Cranial Nerves: II- Loss VA R lower quadrant  (baseline) III/IV/VI-Extraocular movements intact.  Pupils reactive bilaterally. V/VII-Smile asymmetric with flattening L NLF, however improved from yesterday. VIII-grossly intact IX/X-normal gag XI-bilateral shoulder shrug XII-midline tongue extension Motor: 5/5 bilaterally with normal tone and bulk Sensory: Pinprick and light touch intact throughout, bilaterally Deep Tendon Reflexes: 2+ and symmetric throughout Plantars: Downgoing bilaterally Cerebellar: Mild dysmetria L U/LE. Normal FN-HS on R.  Slower RAM on L and orbits Left hand.   Significant Diagnostic Studies: CBC    Component Value Date/Time   WBC 3.9* 11/13/2011 0747   RBC 4.26 11/13/2011 0747   HGB 14.6 11/13/2011 0747   HCT 41.2 11/13/2011 0747   PLT 175 11/13/2011 0747   MCV 96.7 11/13/2011 0747   MCH 34.3* 11/13/2011 0747   MCHC 35.4 11/13/2011 0747   RDW 12.9 11/13/2011 0747   LYMPHSABS 1.1 11/13/2011 0747   MONOABS 0.3 11/13/2011 0747   EOSABS 0.1 11/13/2011 0747   BASOSABS 0.0 11/13/2011 0747   CMP    Component Value Date/Time   NA 141 11/13/2011 0747   K 3.5 11/13/2011 0747   CL 106 11/13/2011 0747   CO2 24 11/13/2011 0747   GLUCOSE 91 11/13/2011 0747   BUN 13 11/13/2011 0747   CREATININE 0.88 11/13/2011 0747   CALCIUM 8.8 11/13/2011 0747   PROT 6.7 11/13/2011 0747  ALBUMIN 3.5 11/13/2011 0747   AST 15 11/13/2011 0747   ALT 15 11/13/2011 0747   ALKPHOS 107 11/13/2011 0747   BILITOT 0.4 11/13/2011 0747   GFRNONAA >90 11/13/2011 0747   GFRAA >90 11/13/2011 0747   COAGS Lab Results  Component Value Date   INR 0.98 11/13/2011   Lipid Panel    Component Value Date/Time   CHOL 183 11/14/2011 0008   TRIG 211* 11/14/2011 0008   HDL 50 11/14/2011 0008   CHOLHDL 3.7 11/14/2011 0008   VLDL 42* 11/14/2011 0008   LDLCALC 91 11/14/2011 0008    Phenytion level 11/13/11 5.0 (L)   11/13/2011    CT HEAD WITHOUT CONTRAST    Comparison: CTA head 01/10/2010.  Findings: An aneurysm clip at the basilar tip is stable.  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  There is a remote infarct of the left thalamus.  Minimal white matter disease is evident.  No acute infarct, hemorrhage, or mass lesion is present.  Remote encephalomalacia of the left parietal and occipital lobe is stable.  The patient is status post left parietal craniotomy and right temporal craniotomy as before.  The paranasal sinuses and mastoid air cells are clear.  IMPRESSION:  1.  No acute intracranial abnormality or significant interval change. 2.  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 3.  Stable appearance of multiple aneurysm clips. 4.  Remote lacunar infarct of the left thalamus.  CHRISTOPHER W. MATTERN, M.D.   11/13/2011   MRI HEAD WITHOUT CONTRAST    Findings: There is some artifact from the aneurysm clips both at the basilar tip and within the left occipital and parietal lobe. The diffusion weighted images demonstrate no acute or subacute infarction. No hemorrhage or mass lesion is present. Encephalomalacia of the left parietal and occipital lobe is stable.  The ventricles are of normal size.  No significant extra-axial fluid collection is present.  Flow is present in the major intracranial arteries. There is scattered opacification of ethmoid air cells.  Minimal mucosal thickening is evident within the anterior sphenoid sinuses and at the floor of the maxillary sinuses bilaterally.  IMPRESSION:  1.  No acute intracranial abnormality. 2. Postoperative changes of bilateral craniotomies with multiple aneurysm clips as described. 3.  Stable encephalomalacia in the left occipital and parietal lobe. 4.  Sinus disease. CHRISTOPHER W. MATTERN, M.D.   11/14/11 MRA HEAD WITHOUT CONTRAST IMPRESSION:  1. Occluded or extremely slow flow within the distal left vertebral artery.  2. Moderate small vessel disease bilaterally.  3. Signal loss of the basilar tip  and within the cavernous right internal carotid artery due to artifact from an aneurysm clip.   CHRISTOPHER W. MATTERN, M.D.  11/14/11 MRA NECK WITHOUT AND WITH CONTRAST  IMPRESSION:  1. 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  2. 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  3. No other significant stenoses within the cervical vasculature.   CHRISTOPHER W. MATTERN, M.D.  2D ECHO 11/13/11: - Left ventricle: The cavity size was normal. Wall thickness was normal. Systolic function was normal. The estimated ejection fraction was in the range of 60% to 65%. Doppler parameters are consistent with abnormal   left ventricular relaxation (grade 1 diastolic dysfunction). - Right atrium: The atrium was mildly dilated. - Pulmonary arteries: PA peak pressure: 32mm Hg (S).  ECG 11/13/11- (prelim) SR, poor R wave progression, R atrial abnormality.  No ischemia.  Carotid Dopplers 11/13/11 (Prelim) No significant ICA stenosis bilaterally. Right vertebral flow is antegrade. Left vertebral flow is atypical (A To and Fro waveform).   Therapies: ST BSSE: Indicates oropharyngeal dysphagia with +s/s of aspiration during and after the swallow of thin liquid secondary to patient taking too large of sips and talking during the swallow. Strategies of small bites/small sips, swallow x2 with solids, and using effortful swallow effective in decreasing outward s/s of aspiration. Recommend full supervion with all meals secondary to decreased safety awareness to cue patient to complete necessary precautions. Recommend objective evaluation of MBS to assess risk of aspiration secondary to s/s present at BSE, PMH, and due to patient reports of "food , (especially breads), sticking ". ST to follow in acute setting for diet tolerance.  PT: Pt with decreased sitting and standing balance, decreased coordination and left listing during ambulation.  Rec HH with 24/7 vs CIR  pending progress.  ASSESSMENT Eric Craig is a 51 y.o. male with 1. 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  2. R visual field deficit chronic.  Diploplia not present on exam. 3. Epilepsy.  Stroke risk factors: vascular disease. . The patient was not on aspirin PTA.  Hospital day # 3  TREATMENT/PLAN 1. Continue EC ASA 81 mg daily for secondary stroke prevention. 2. Home health PT, RW  3. Recommend discharge home 4. Stroke Service will sign off. Follow up with Dr. Sandria Manly.  Joaquin Music, ANP-BC, GNP-BC [facility] Pager: [phone] 11/16/2011 11:50 AM  Dr. Delia Heady, Stroke Center Medical Director, has personally reviewed chart, pertinent data, examined the patient and developed the plan of care.

## 2011-11-16 NOTE — Progress Notes (Signed)
Subjective: No acute events overnight. The patient feel's well. His balance, facial numbness, and tinnitus are improving. He has been educated on careful eating, and is currently eating full meals with small bites.  Objective: Vital signs in last 24 hours: Filed Vitals:   11/15/11 2200 11/16/11 0153 11/16/11 0558 11/16/11 1019  BP: 147/85 153/81 116/81 127/82  Pulse: 72 72 83 75  Temp: 98.4 F (36.9 C) 97.7 F (36.5 C) 98.5 F (36.9 C) 97.5 F (36.4 C)  TempSrc: Oral Oral Oral Oral  Resp: 18 18 18 20   Height:      Weight:      SpO2: 95% 94% 95% 99%   Constitutional: Alert and orientated to person, time,and place.  Head: Well healed scar on forehead. Normocephalic. Atraumatic  Eyes: EOMI. Visual Field deficit in right lower visual fields. Pupils equally round and reactive to light and accomodation. Decreased horizontal nystagmus compared to previous days.  Neck: No thyroidmegaly. No cervical lymphadenopathy.  Pulmonary: No crackles, wheezing, or rhonchii.  Cardiovascular: Regular rate and rhythm. No murmurs, rubs, or gallops. No JVD. 2+ radial and pedal pulses. Capillary refill < 2s.  Gastrointestinal: Bowel sounds present. Soft. Non-tender. No hepatosplenomegaly.  Extremities: No LE edema. Acyanotic  Neuro: CN 2-12 intact. Visual field deficit in right lower visual fields. Normal soft-touch to face, arms, hands, and feet bilaterally. 5/5 strength of grip, leg flexion, leg extension, and feet bilaterally. 5/5 strength of right arm flexion and extension. 4/5 strength of left arm flexion and extension. Normal rapid alternating finger movements bilaterally. Patient's balance has improved upon standing.   Speech Therapy 12/13/2011: Clinical impression: Moderate sensory and anatomical oropharyngeal dysphagia . Patient with hardware at C-4 to C-5 s/p ACDF (1989) impinging posteior pharyngeal wall causing bolus obstruction above level of C-4 with trial of mechanical soft resulting in flash  penetration during the swallow. First trial of regular solid patient initiated swallow at level of valleculae but immediately expectorated bolus back to oral cavity. No other occurences of penetration or aspiration . Question cricopharyngeal dysfunction secondary to residual material remaining in pyriform recess s/p swallow of all PO trials including thin liquids increasing risk for aspiration after the swallow. Strategies effective in clearing residuals are swallow x 2 paired with chin tuck. Recommend full supervision with all meals to cue patient to take small bites/sips and eat at slow rate as aspiration risk remains high without strategies.  Swallow Evaluation Recommendations  Solid Consistency: Regular  Liquid Consistency: Thin   Medications: I have reviewed the patient's current medications. Scheduled Meds:   . aspirin EC  81 mg Oral Daily  . carbamazepine  200 mg Oral Daily  . carbamazepine  300 mg Oral QHS  . enoxaparin  40 mg Subcutaneous Q24H  . pantoprazole  40 mg Oral Q1200  . phenytoin  100 mg Oral QHS  . phenytoin  200 mg Oral Daily  . phenytoin  200 mg Oral QHS  . 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 His neurologic deficits are improving.   1. Dizziness: Dizziness have improved, along with his facial numbness and tinnitus. Nystagmus is decreased. Physical presentation is most consistent with a medullary CVA. MRI/MRA/CT have not identified any new neurological pathology. [facility] has seen the patient and do not think he needs inpatient rehabilitation.   -- ASA 325  --  PT and OT evaluation. If comfortable with outpatient management, will discharge today.  -- Follow-up with Dr. Sandria Manly as outpatient.   2. Dysphagia: Related to his presumed CVA.  Symptoms are improving. Denies any choking or vomiting. Speech therapy performed evaluation with modified barium swallow and recommends a regular diet and thin liquids.   3. Seizure: The patient has not experienced any seizures. We are maintaining his outpatient reginem.  -- Carbamazepine 200mg  po qAM and 300mg  po qPM  -- Phenytoin 200mg  po qAM and 100mg  po qPM   4. Hypertension: BP in the morning is 127/82. BP has gradually decreased since admission. Will continue to follow as outpatient.   5 . Arteriovenous Malformation: Longstanding. Likely responsible for patient's right lower quadrant visual field defects. Stable on CT.   6. 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   7. Prophylaxis: Enoxaparin  Dispo: Home today   LOS: 3 days   Wilmer Floor 11/16/2011, 11:09 AM  Addendum:  Patient had no new complaints today. He was eager to go home.  BP 127/82  Pulse 75  Temp(Src) 97.5 F (36.4 C) (Oral)  Resp 20  Ht 5\' 11"  (1.803 m)  Wt 167 lb 1.7 oz (75.8 kg)  BMI 23.31 kg/m2  SpO2 99% Gen: AO X 3 Lungs: CTAB Chest: RRR, No MM,Rubs or gallops Abdomen: soft NT, bs present in all 4 quadrants, no organomegaly noted Ext: moving all 4 limbs well Neuro: right lower visual field defect, still have gait abnormalities and unable to perform F to N on left, no motor, sensory or CN defect noted.  A and P: 1. Dizziness  2. AVM's 3. Seizure disorder 4. dyshagia 5. Cervical spine degeneration 6. HTN well controled without meds at this time  CIR had suggested against inpatient rehab as patient has good family support. Neuro agreeable to d/c home today. Patient will be discharged home today with [facility] PT[facility]OT. He will follow up with Dr. Sandria Manly and Dr. Jacky Kindle. Aspirin was added to his list of medications.

## 2013-02-28 ENCOUNTER — Telehealth: Payer: Self-pay | Admitting: Neurology

## 2013-02-28 NOTE — Telephone Encounter (Signed)
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  Please advise.  Thank you.

## 2013-03-03 ENCOUNTER — Telehealth: Payer: Self-pay | Admitting: Neurology

## 2013-03-05 MED ORDER — PHENYTOIN SODIUM EXTENDED 100 MG PO CAPS: ORAL_CAPSULE | ORAL | Status: DC

## 2013-03-05 MED ORDER — CARBAMAZEPINE ER 300 MG PO CP12: 300.0000 mg | ORAL_CAPSULE | Freq: Two times a day (BID) | ORAL | Status: DC

## 2013-03-05 NOTE — Telephone Encounter (Signed)
Former [facility] patient assigned to Dr Hosie Poisson, who does not start working at our practice until July 14th.  Approved refill via [facility] until new doctor arrives at our office.

## 2013-04-27 ENCOUNTER — Ambulatory Visit: Payer: Self-pay | Admitting: Neurology

## 2013-05-02 ENCOUNTER — Encounter: Payer: Self-pay | Admitting: Neurology

## 2013-05-04 ENCOUNTER — Encounter: Payer: Self-pay | Admitting: Neurology

## 2013-05-04 ENCOUNTER — Ambulatory Visit (INDEPENDENT_AMBULATORY_CARE_PROVIDER_SITE_OTHER): Payer: 59 | Admitting: Neurology

## 2013-05-04 VITALS — BP 148/96 | HR 86 | Ht 70.5 in | Wt 194.0 lb

## 2013-05-04 DIAGNOSIS — I639 Cerebral infarction, unspecified: Secondary | ICD-10-CM

## 2013-05-04 DIAGNOSIS — R569 Unspecified convulsions: Secondary | ICD-10-CM

## 2013-05-04 DIAGNOSIS — I6350 Cerebral infarction due to unspecified occlusion or stenosis of unspecified cerebral artery: Secondary | ICD-10-CM

## 2013-05-04 DIAGNOSIS — G243 Spasmodic torticollis: Secondary | ICD-10-CM | POA: Insufficient documentation

## 2013-05-04 MED ORDER — PHENYTOIN SODIUM EXTENDED 100 MG PO CAPS: 300.0000 mg | ORAL_CAPSULE | Freq: Every day | ORAL | Status: DC

## 2013-05-04 NOTE — Progress Notes (Signed)
HPI: This patient is a 52 y.o. male presenting for follow up of his seizure disorder . Interval:  Overall reports doing well. Denies any acute problems. He continues on Dilantin and Tegretol. He is not taking medications as prescribed. Instead he is taking Dilantin 300mg  daily and Tegretol 300mg  daily. He reports having been on this regimen for months/years. He reports dizzyness has resolved. Notes some intermittent tinnitus in bilat ears, notes that this started after questionable stroke event in 2013. Denies any whoosing sound in his ears.   Notes a chronic history of neck and cervical pain for >65yr. Currently stable. Notes head pulling/tilting to the L, tenderness and pain in L trapezius. Has hx of multiple cervical surgeries.    Per Dr Alena Bills prior note from 11/08/2012: Is a 52y/o gentleman with hx of complex partial and secondarily generalized seizures starting in 1979. Complicated personal history involving L occipital AVM s/o hemorrhage in 1997, basilar artery aneurysm, R temporal craniotomy for aneurysm surgery in 1997, invasive loop procedure for AVM treatment + removal 1997. He has a R homonymous hemianopsia. Has recurrent cervical radiculoapthy s/p multiple laminectomies and fusions of C4-5, C5-6 and C6-7.  On 11/12/2011 developed hot flashes, dizziness and difficulty swallowing, next day felt drunk, slurred speech, could not walk, noted L facial weakness, ptosis and numbness. CT head showed no acute processes.  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   I fully reviewed and discussed the patients medications, allergies, PMHX, Surgical Hx and Social History.   ROS: Constitutional denies fevers chills weight gain weight loss  Eyes denies blurred vision double vision loss of vision  Hematology denies anemia easy bruising easy bleeding  Cardiovascular denies chest pain palpitations murmurs  Respiratory no shortness of breath cough wheezing  Endocrine  denies feeling hot feeling cold increased thirst  Dear nose and throat positive ringing in ears denies hearing loss in sensation  Gastrointestinal denies blood in stool incontinence diarrhea  Musculoskeletal denies joint pain joint swelling cramps  Neurological denies any recent seizures denies murmur of confusion headache numbness weakness slurred speech  Psychiatric denies depression anxiety decreased energy  All other ROS are negative     Exam: Gen: NAD, conversant Eyes: anicteric sclerae, moist conjunctivae HENT: Atraumati Neck: Trachea midline; supple,  Lungs: CTA, no wheezing, rales, rhonic                          CV: RRR, no MRG Abdomen: Soft, non-tender;  Extremities: No peripheral edema  Skin: Normal temperature, no rash,  Psych: Appropriate affect, pleasant  Neuro: MS: AA&Ox3, appropriately interactive, normal affect   Attention: WORLD backwards  Speech: fluent w/o paraphasic error  Memory: good recent and remote recall  CN: PERRL, R inferior > superior hemianopsia,EOMI w/ downbeating nystagmus, no ptosis, sensation intact to LT V1-V3 bilat, face symmetric, no weakness, hearing grossly intact, palate elevates symmetrically, shoulder shrug 5/5 bilat,  tongue protrudes midline, no fasiculations noted.  Motor:  Noted L laterocolis, R torticolis, tenderness/pain with palpation in L trapezius and R trapezius noted atrophy LUE and LLE Strength: 5/5  In all extremities except weakness in LUE  Coord: rapid alternating and point-to-point (FNF, HTS) movements intact.  Reflexes: symmetrical, bilat downgoing toes  Sens: LT intact in all extremities  Gait: posture, stance, stride and arm-swing normal. Unable to tandem. Able to walk on heels and toes. Romberg negative.    Assessment/Plan: [name] is a pleasant 52 y/o gentleman  with a complicated history including seizure disorder, multiple vascular events/strokes, cervical surgeries presenting for follow up.  Clinically appears stable, notes that he has been taking a lower dose of his AEDs for the past few visits and has been tolerating these lower doses well.   1)Seizures - Plan: phenytoin (DILANTIN) 100 MG ER capsule -clinically stable, patient reports no seizure activity since 2001 -as he has been taking a subtherapeutic dose of tegretol will plan to taper him off of this. Patient expresses understanding that tapering off this may increase his risk of having breakthrough seizures. He wishes to proceed even with this risk -continue Dilantin ER 300mg  daily, will plan to check level along with basic labs/lft's at next visit  2)Spasmodic torticollis- exam shows L laterocolis,R torticolis, tenderness to palpation in bilat trapezius, has been ongoing for >54yr, currently pain is stable -discussed the diagnosis with the patient -discussed different therapeutic options including PT referral, heating pad and botulinum toxin injections  3)Stroke -cllincally stable, will continue to monitor -continue ASA 81mg  daily  A total of was spent in with this patient. Over half this time was spent on counseling patient on the diagnosis and different therapeutic options available.  We discussed the benefits/risks of tapering off of tegretol, patient was counseled extensively on the risk of increased seizure frequency and expressed understanding. The diagnosis of CD was also discussed along with pontential treatment options. All questions were answered.

## 2013-05-04 NOTE — Patient Instructions (Signed)
Overall you are doing fairly well but I do want to suggest a few things today:   Remember to drink plenty of fluid, eat healthy meals and do not skip any meals. Try to eat protein with a every meal and eat a healthy snack such as fruit or nuts in between meals. Try to keep a regular sleep-wake schedule and try to exercise daily, particularly in the form of walking, 20-30 minutes a day, if you can.   As far as your medications are concerned, I would like to suggest that we taper you off of the carbatrol. Continue taking this medication until you run out and then discontinue. Continue on the Dilantin ER 300mg  daily We will consider checking levels at the next visit Continue on the calcium supplement  I would like to see you back in 4 to 6 months, sooner if we need to. Please call us with any interim questions, concerns, problems, updates or refill requests.   Please also call us for any test results so we can go over those with you on the phone.  My clinical assistant and will answer any of your questions and relay your messages to me and also relay most of my messages to you.   Our phone number is [phone]. We also have an after hours call service for urgent matters and there is a physician on-call for urgent questions. For any emergencies you know to call 911 or go to the nearest emergency room

## 2013-05-04 NOTE — Progress Notes (Deleted)
Subjective:    Patient ID: Eric Craig is a 52 y.o. male.  HPI The following portions of the patient's history were reviewed and updated as appropriate: current medications, past family history, past medical history, past social history, past surgical history and problem list.  Review of Systems  Constitutional: Positive for fever, chills, diaphoresis, activity change, appetite change, fatigue and unexpected weight change.  Eyes: Negative.   Respiratory: Negative.   Cardiovascular: Negative.     Objective:  Neurologic Exam  Physical Exam  Assessment:   ***  Plan:   ***

## 2013-05-12 ENCOUNTER — Telehealth: Payer: Self-pay | Admitting: Neurology

## 2013-05-12 DIAGNOSIS — R569 Unspecified convulsions: Secondary | ICD-10-CM

## 2013-05-12 MED ORDER — PHENYTOIN SODIUM EXTENDED 100 MG PO CAPS: 300.0000 mg | ORAL_CAPSULE | Freq: Every day | ORAL | Status: DC

## 2013-05-12 NOTE — Telephone Encounter (Signed)
Rx resent to [facility]. °

## 2013-07-20 ENCOUNTER — Other Ambulatory Visit: Payer: Self-pay | Admitting: Neurology

## 2013-11-06 ENCOUNTER — Encounter (INDEPENDENT_AMBULATORY_CARE_PROVIDER_SITE_OTHER): Payer: Self-pay

## 2013-11-06 ENCOUNTER — Encounter: Payer: Self-pay | Admitting: Neurology

## 2013-11-06 ENCOUNTER — Ambulatory Visit (INDEPENDENT_AMBULATORY_CARE_PROVIDER_SITE_OTHER): Payer: 59 | Admitting: Neurology

## 2013-11-06 VITALS — BP 146/95 | HR 79 | Ht 71.0 in | Wt 196.0 lb

## 2013-11-06 DIAGNOSIS — I6350 Cerebral infarction due to unspecified occlusion or stenosis of unspecified cerebral artery: Secondary | ICD-10-CM

## 2013-11-06 DIAGNOSIS — R569 Unspecified convulsions: Secondary | ICD-10-CM

## 2013-11-06 DIAGNOSIS — I639 Cerebral infarction, unspecified: Secondary | ICD-10-CM

## 2013-11-06 DIAGNOSIS — G243 Spasmodic torticollis: Secondary | ICD-10-CM

## 2013-11-06 MED ORDER — DILANTIN 100 MG PO CAPS: 300.0000 mg | ORAL_CAPSULE | Freq: Every day | ORAL | Status: DC

## 2013-11-06 MED ORDER — LEVETIRACETAM 750 MG PO TABS: 750.0000 mg | ORAL_TABLET | Freq: Two times a day (BID) | ORAL | Status: DC

## 2013-11-06 NOTE — Patient Instructions (Signed)
Overall you are doing fairly well but I do want to suggest a few things today:   As far as your medications are concerned, I would like to suggest the following: 1)Start Keppra 750mg  twice a day 2)Once you start the keppra, continue the Dilantin 300mg  for 14 days, then decrease to 200mg  daily for 14 days, then decrease to 100mg  daily for 1 month and then discontinue  If you have any concerns about breakthrough seizures please immediately call our office.   I would like to see you back in 6 months, sooner if we need to. Please call us with any interim questions, concerns, problems, updates or refill requests.   My clinical assistant and will answer any of your questions and relay your messages to me and also relay most of my messages to you.   Our phone number is [phone]. We also have an after hours call service for urgent matters and there is a physician on-call for urgent questions. For any emergencies you know to call 911 or go to the nearest emergency room

## 2013-11-06 NOTE — Progress Notes (Signed)
[facility]   Provider:  Dr Janann Colonel Referring Provider: Geoffery Lyons, MD Primary Care Physician:  Geoffery Lyons, MD  CC:  seizures  HPI:  Eric Craig is a 53 y.o. male here as a follow up. He has a history of seizure disorder. 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 He was tapered off his tegretol and the plan was to check Dilantin level and labs at todays visit.   Currently doing well. Has tapered off the Tegretol with no issues. No further seizure no break through seizures. Tolerating Dilantin well. Continues to take 300mg  daily in the morning.   Denies any current neck pain, does have continued head tilting to the right but states it is not currently bothering him.   Raises question about continued use of ASA, states he has increased bleeding when he cuts himself but otherwise is tolerating it well.    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 Complicated personal history involving L occipital AVM s/o hemorrhage in 1997, basilar artery aneurysm, R temporal craniotomy for aneurysm surgery in 1997, invasive loop procedure for AVM treatment + removal 1997. He has a R homonymous hemianopsia. Has recurrent cervical radiculoapthy s/p multiple laminectomies and fusions of C4-5, C5-6 and C6-7. On 11/12/2011 developed hot flashes, dizziness and difficulty swallowing, next day felt drunk, slurred speech, could not walk, noted L facial weakness, ptosis and numbness. CT head showed no acute processes. 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    Concerns/Questions:Review of Systems: Out of a complete 14 system review, the patient complains of only the following symptoms, and all other reviewed systems are  negative. Denies any positive review of systems  History   Social History  . Marital Status: Single    Spouse Name: N/A    Number of Children: 2  . Years of Education: 12   Occupational History  . [place] History Main Topics  . Smoking status: Former Smoker -- 1.00 packs/day for 40 years    Types: Cigarettes  . Smokeless tobacco: Not on file  . Alcohol Use: Yes     Comment: Drinks 12 beers per year  . Drug Use: No  . Sexual Activity: Not on file   Other Topics Concern  . Not on file   Social History Narrative   Lives at home with his wife (cell phone number [phone]). Has 2 daughters that accompany him here today.    Patient is left handed   Education level is some vocational college   Caffeine consumption is 4 cups daily    Family History  Problem Relation Age of Onset  . Aortic aneurysm Mother 68  . Hypertension Father   . Heart disease Other     Past Medical History  Diagnosis Date  . 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 Seizures     Last seizure was 2001  . Cervical radiculopathy     recurrent  . Headache(784.0)   . Hx of migraine headaches   . Malformation     occipital arteriovenous  . Hemianopsia     residual right homoinmous  . 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 Neck surgery      C4-5 repair in 1989. C6-7 repair in 1993 and 2001. Residual left arm weakness  . Brain surgery      Basilar artery aneurysm repair in 1997, with facial reconstruction in 1998  . Right temporal craniotomy  2/97    basilarr artery aneurysm  . Invasive loop procedure  [place] for treatment of the AVM   . Removal of the arteriovenous malformation  1997    [place]  . C5-c6 laminectomy  1/89  . Cervical discectomy  6/89    C5-C6-7:04/1992, C4-5, w/ plate fusion for left C5 radiculopathy 12/00    Current Outpatient  Prescriptions  Medication Sig Dispense Refill  . calcium carbonate (OS-CAL) 600 MG TABS Take 600 mg by mouth daily.      Marland Kitchen DILANTIN 100 MG ER capsule Take 3 capsules (300 mg total) by mouth daily.  270 capsule  1  . Multiple Vitamin (MULTIVITAMIN) capsule Take 1 capsule by mouth daily. am      . aspirin (ASPIRIN EC) 81 MG EC tablet Take 1 tablet (81 mg total) by mouth daily. Swallow whole.       No current facility-administered medications for this visit.    Allergies as of 11/06/2013  . (No Known Allergies)    Vitals: BP 146/95  Pulse 79  Ht 5\' 11"  (1.803 m)  Wt 196 lb (88.905 kg)  BMI 27.35 kg/m2 Last Weight:  Wt Readings from Last 1 Encounters:  11/06/13 196 lb (88.905 kg)   Last Height:   Ht Readings from Last 1 Encounters:  11/06/13 5\' 11"  (1.803 m)     Physical exam: Gen: NAD, conversant  Eyes: anicteric sclerae, moist conjunctivae  HENT: Atraumati  Neck: Trachea midline; supple,  Lungs: CTA, no wheezing, rales, rhonic  CV: RRR, no MRG  Abdomen: Soft, non-tender;  Extremities: No peripheral edema  Skin: Normal temperature, no rash,  Psych: Appropriate affect, pleasant  Neuro:  MS: AA&Ox3, appropriately interactive, normal affect   Speech: fluent w/o paraphasic error   Memory: good recent and remote recall   CN:  PERRL, R inferior > superior hemianopsia,EOMI w/ downbeating nystagmus, no ptosis, sensation intact to LT V1-V3 bilat, face symmetric, no weakness, hearing grossly intact, palate elevates symmetrically, shoulder shrug 5/5 bilat,  tongue protrudes midline, no fasiculations noted.  Motor:  Noted R laterocolis, L torticolis, tenderness/pain with palpation in L trapezius and R trapezius  noted atrophy LUE and LLE  Strength:  5/5 In all extremities except weakness in LUE  Coord: mild end point intention tremor bilat hands R>L  Reflexes: symmetrical, bilat downgoing toes  Sens: LT intact in all extremities  Gait: posture, stance, stride and  arm-swing normal. Unable to tandem. Able to walk on heels and toes. Romberg negative.     Assessment:  After physical and neurologic examination, review of laboratory studies, imaging, neurophysiology testing and pre-existing records, assessment will be reviewed on the problem list.  Plan:  Treatment plan and additional workup will be reviewed under Problem List.  1)Seizures 2)Cervical Dystonia 3)CVA/aneursym history  Mr Boker is a pleasant 53 y/o gentleman with a complicated history including seizure disorder, multiple vascular events/strokes, cervical surgeries presenting for follow up. Clinically appears stable, tolerated coming off the tegretol well. Exam is overall stable though he does appear to have some new subtle cerebellar findings. Discussed with patient my concerns over the long term  use of dilantin in him in regards to side effect profile, especially cerebellar toxicity. Discussed benefits/risks of transitioning to a better tolerated AED. Patient expressed understanding and would like to switch. Will start Keppra 750mg  BID and slowly transition patient off of Dilantin. If any breakthrough seizures will restart/titrate up dilantin. Continue ASA 81mg  daily for stroke prevention. Follow up in 6 months.   Jim Like, DO  [facility] [address] [place], [place] [address]  Phone [phone] Fax [phone]

## 2013-11-27 ENCOUNTER — Telehealth: Payer: Self-pay | Admitting: Neurology

## 2013-11-27 NOTE — Telephone Encounter (Signed)
°

## 2013-11-27 NOTE — Telephone Encounter (Signed)
Pt was called by Dr. Janann Colonel.

## 2013-11-27 NOTE — Telephone Encounter (Signed)
°  Pt's wife called. She stated that PT is transitioning from dilantin to [place]. The transition is not going well. He is dizzy disoriented and very sleep and just not himself. He wants to stay with the dilantin. Please call Mr. Livesey at [phone] before 5 pm or home number after 5 pm. Thank you.

## 2013-11-27 NOTE — Telephone Encounter (Signed)
Patient to try taking 375mg  keppra bid for 10 days, if no improvement in symptoms then will d/c keppra and keep patient on dilantin.

## 2013-12-01 NOTE — Telephone Encounter (Signed)
Pt came in for his visit, closing encounter °

## 2014-03-11 ENCOUNTER — Encounter (HOSPITAL_COMMUNITY): Payer: Self-pay | Admitting: Emergency Medicine

## 2014-03-11 ENCOUNTER — Emergency Department (HOSPITAL_COMMUNITY)
Admission: EM | Admit: 2014-03-11 | Discharge: 2014-03-11 | Disposition: A | Discharge status: Home / Self Care | Payer: 59 | Attending: Emergency Medicine | Admitting: Emergency Medicine

## 2014-03-11 ENCOUNTER — Emergency Department (HOSPITAL_COMMUNITY): Payer: 59

## 2014-03-11 DIAGNOSIS — Z8669 Personal history of other diseases of the nervous system and sense organs: Secondary | ICD-10-CM | POA: Insufficient documentation

## 2014-03-11 DIAGNOSIS — Z9861 Coronary angioplasty status: Secondary | ICD-10-CM | POA: Insufficient documentation

## 2014-03-11 DIAGNOSIS — Z8679 Personal history of other diseases of the circulatory system: Secondary | ICD-10-CM | POA: Insufficient documentation

## 2014-03-11 DIAGNOSIS — Z87798 Personal history of other (corrected) congenital malformations: Secondary | ICD-10-CM | POA: Insufficient documentation

## 2014-03-11 DIAGNOSIS — R569 Unspecified convulsions: Secondary | ICD-10-CM | POA: Insufficient documentation

## 2014-03-11 DIAGNOSIS — Z8739 Personal history of other diseases of the musculoskeletal system and connective tissue: Secondary | ICD-10-CM | POA: Insufficient documentation

## 2014-03-11 DIAGNOSIS — IMO0002 Reserved for concepts with insufficient information to code with codable children: Secondary | ICD-10-CM | POA: Insufficient documentation

## 2014-03-11 DIAGNOSIS — N200 Calculus of kidney: Secondary | ICD-10-CM | POA: Insufficient documentation

## 2014-03-11 DIAGNOSIS — Z87891 Personal history of nicotine dependence: Secondary | ICD-10-CM | POA: Insufficient documentation

## 2014-03-11 LAB — CBC WITH DIFFERENTIAL/PLATELET
BASOS ABS: 0 10*3/uL (ref 0.0–0.1)
BASOS PCT: 0 % (ref 0–1)
EOS ABS: 0.1 10*3/uL (ref 0.0–0.7)
EOS PCT: 2 % (ref 0–5)
HEMATOCRIT: 43.8 % (ref 39.0–52.0)
HEMOGLOBIN: 15.3 g/dL (ref 13.0–17.0)
Lymphocytes Relative: 30 % (ref 12–46)
Lymphs Abs: 1.4 10*3/uL (ref 0.7–4.0)
MCH: 34.4 pg — AB (ref 26.0–34.0)
MCHC: 34.9 g/dL (ref 30.0–36.0)
MCV: 98.4 fL (ref 78.0–100.0)
MONO ABS: 0.4 10*3/uL (ref 0.1–1.0)
MONOS PCT: 9 % (ref 3–12)
NEUTROS ABS: 2.6 10*3/uL (ref 1.7–7.7)
Neutrophils Relative %: 59 % (ref 43–77)
Platelets: 184 10*3/uL (ref 150–400)
RBC: 4.45 MIL/uL (ref 4.22–5.81)
RDW: 13.3 % (ref 11.5–15.5)
WBC: 4.6 10*3/uL (ref 4.0–10.5)

## 2014-03-11 LAB — URINALYSIS, ROUTINE W REFLEX MICROSCOPIC
BILIRUBIN URINE: NEGATIVE
GLUCOSE, UA: NEGATIVE mg/dL
Hgb urine dipstick: NEGATIVE
KETONES UR: NEGATIVE mg/dL
LEUKOCYTES UA: NEGATIVE
Nitrite: NEGATIVE
PROTEIN: NEGATIVE mg/dL
Specific Gravity, Urine: 1.037 — ABNORMAL HIGH (ref 1.005–1.030)
Urobilinogen, UA: 0.2 mg/dL (ref 0.0–1.0)
pH: 5 (ref 5.0–8.0)

## 2014-03-11 LAB — BASIC METABOLIC PANEL
BUN: 21 mg/dL (ref 6–23)
CALCIUM: 9.5 mg/dL (ref 8.4–10.5)
CO2: 26 mEq/L (ref 19–32)
CREATININE: 1.21 mg/dL (ref 0.50–1.35)
Chloride: 104 mEq/L (ref 96–112)
GFR, EST AFRICAN AMERICAN: 76 mL/min — AB (ref >90)
GFR, EST NON AFRICAN AMERICAN: 66 mL/min — AB (ref >90)
Glucose, Bld: 99 mg/dL (ref 70–99)
Potassium: 4.6 mEq/L (ref 3.7–5.3)
Sodium: 142 mEq/L (ref 137–147)

## 2014-03-11 MED ORDER — OXYCODONE-ACETAMINOPHEN 5-325 MG PO TABS
2.0000 | ORAL_TABLET | Freq: Once | ORAL | Status: AC
  Administered 2014-03-11: 2 via ORAL
  Filled 2014-03-11: qty 2

## 2014-03-11 MED ORDER — ONDANSETRON HCL 4 MG/2ML IJ SOLN
4.0000 mg | Freq: Once | INTRAMUSCULAR | Status: AC
  Administered 2014-03-11: 4 mg via INTRAVENOUS
  Filled 2014-03-11: qty 2

## 2014-03-11 MED ORDER — OXYCODONE-ACETAMINOPHEN 5-325 MG PO TABS
2.0000 | ORAL_TABLET | ORAL | Status: DC | PRN
Start: 2014-03-11 — End: 2015-05-15

## 2014-03-11 MED ORDER — MORPHINE SULFATE 4 MG/ML IJ SOLN
4.0000 mg | Freq: Once | INTRAMUSCULAR | Status: AC
  Administered 2014-03-11: 4 mg via INTRAVENOUS
  Filled 2014-03-11: qty 1

## 2014-03-11 MED ORDER — FENTANYL CITRATE 0.05 MG/ML IJ SOLN
100.0000 ug | Freq: Once | INTRAMUSCULAR | Status: AC
  Administered 2014-03-11: 100 ug via INTRAVENOUS
  Filled 2014-03-11: qty 2

## 2014-03-11 MED ORDER — ONDANSETRON 8 MG PO TBDP: 8.0000 mg | ORAL_TABLET | Freq: Three times a day (TID) | ORAL | Status: DC | PRN

## 2014-03-11 NOTE — Discharge Instructions (Signed)
You have a 3 mm stone in your left ureter, about to pass into your bladder.  Given its size, it should pass on its own.  Take pain and nausea medications as needed.  Follow up with urology.  Return to the ER (preferably [facility] as the urology staff prefers this hospital) for worsening pain, fevers, or other concerning symptoms.   Kidney Stones Kidney stones (urolithiasis) are deposits that form inside your kidneys. The intense pain is caused by the stone moving through the urinary tract. When the stone moves, the ureter goes into spasm around the stone. The stone is usually passed in the urine.  CAUSES   A disorder that makes certain neck glands produce too much parathyroid hormone (primary hyperparathyroidism).  A buildup of uric acid crystals, similar to gout in your joints.  Narrowing (stricture) of the ureter.  A kidney obstruction present at birth (congenital obstruction).  Previous surgery on the kidney or ureters.  Numerous kidney infections. SYMPTOMS   Feeling sick to your stomach (nauseous).  Throwing up (vomiting).  Blood in the urine (hematuria).  Pain that usually spreads (radiates) to the groin.  Frequency or urgency of urination. DIAGNOSIS   Taking a history and physical exam.  Blood or urine tests.  CT scan.  Occasionally, an examination of the inside of the urinary bladder (cystoscopy) is performed. TREATMENT   Observation.  Increasing your fluid intake.  Extracorporeal shock wave lithotripsy This is a noninvasive procedure that uses shock waves to break up kidney stones.  Surgery may be needed if you have severe pain or persistent obstruction. There are various surgical procedures. Most of the procedures are performed with the use of small instruments. Only small incisions are needed to accommodate these instruments, so recovery time is minimized. The size, location, and chemical composition are all important variables that will determine the proper  choice of action for you. Talk to your health care provider to better understand your situation so that you will minimize the risk of injury to yourself and your kidney.  HOME CARE INSTRUCTIONS   Drink enough water and fluids to keep your urine clear or pale yellow. This will help you to pass the stone or stone fragments.  Strain all urine through the provided strainer. Keep all particulate matter and stones for your health care provider to see. The stone causing the pain may be as small as a grain of salt. It is very important to use the strainer each and every time you pass your urine. The collection of your stone will allow your health care provider to analyze it and verify that a stone has actually passed. The stone analysis will often identify what you can do to reduce the incidence of recurrences.  Only take over-the-counter or prescription medicines for pain, discomfort, or fever as directed by your health care provider.  Make a follow-up appointment with your health care provider as directed.  Get follow-up X-rays if required. The absence of pain does not always mean that the stone has passed. It may have only stopped moving. If the urine remains completely obstructed, it can cause loss of kidney function or even complete destruction of the kidney. It is your responsibility to make sure X-rays and follow-ups are completed. Ultrasounds of the kidney can show blockages and the status of the kidney. Ultrasounds are not associated with any radiation and can be performed easily in a matter of minutes. SEEK MEDICAL CARE IF:  You experience pain that is progressive and unresponsive  to any pain medicine you have been prescribed. SEEK IMMEDIATE MEDICAL CARE IF:   Pain cannot be controlled with the prescribed medicine.  You have a fever or shaking chills.  The severity or intensity of pain increases over 18 hours and is not relieved by pain medicine.  You develop a new onset of abdominal  pain.  You feel faint or pass out.  You are unable to urinate. MAKE SURE YOU:   Understand these instructions.  Will watch your condition.  Will get help right away if you are not doing well or get worse. Document Released: 09/28/2005 Document Revised: 05/31/2013 Document Reviewed: 03/01/2013 [facility] Patient Information 2014 [place].

## 2014-03-11 NOTE — ED Notes (Signed)
Pt arrived to the ED with a complaint of left sided flank pain.  Pt states the pain began around 2100 hrs.  Pt has a hx of kidney stones.  Pain radiates from the back left flank to the front lower abdomen.

## 2014-03-11 NOTE — ED Provider Notes (Signed)
CSN: [id number]     Arrival date & time 03/11/14  0214 History   First MD Initiated Contact with Patient 03/11/14 [phone]     Chief Complaint  Patient presents with  . Flank Pain     (Consider location/radiation/quality/duration/timing/severity/associated sxs/prior Treatment) HPI 53 year old male presents to emergency room with complaint of left flank pain radiating into his left abdomen.  Symptoms started around 9 PM.  Pain is sharp and crampy.  Patient reports kidney stone about 10 years ago with similar symptoms.  He's had nausea without vomiting.  Normal bowel movement yesterday.  No fevers or chills. Past Medical History  Diagnosis Date  . 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 Seizures     Last seizure was 2001  . Cervical radiculopathy     recurrent  . Headache(784.0)   . Hx of migraine headaches   . Malformation     occipital arteriovenous  . Hemianopsia     residual right homoinmous  . 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 Neck surgery      C4-5 repair in 1989. C6-7 repair in 1993 and 2001. Residual left arm weakness  . Brain surgery      Basilar artery aneurysm repair in 1997, with facial reconstruction in 1998  . Right temporal craniotomy  2/97    basilarr artery aneurysm  . Invasive loop procedure  [place] for treatment of the AVM   . Removal of the arteriovenous malformation  1997    [place]  . C5-c6 laminectomy  1/89  . Cervical discectomy  6/89    C5-C6-7:04/1992, C4-5, w/ plate fusion for left C5 radiculopathy 12/00   Family History  Problem Relation Age of Onset  . Aortic aneurysm Mother 59  . Hypertension Father   . Heart disease Other    History  Substance Use Topics  . Smoking status: Former Smoker -- 1.00 packs/day for 40 years    Types: Cigarettes  . Smokeless tobacco: Not on file  . Alcohol Use: Yes     Comment: Drinks 12  beers per year    Review of Systems   See History of Present Illness; otherwise all other systems are reviewed and negative  Allergies  Review of patient's allergies indicates no known allergies.  Home Medications   Prior to Admission medications   Medication Sig Start Date End Date Taking? Authorizing Provider  calcium carbonate (OS-CAL) 600 MG TABS Take 600 mg by mouth daily.   Yes Historical Provider, MD  DILANTIN 100 MG ER capsule Take 3 capsules (300 mg total) by mouth daily. Do not mail until patient calls 11/06/13  Yes Hulen Luster, DO  fluticasone [facility]) 50 MCG/ACT nasal spray Place 1 spray into both nostrils daily as needed for allergies or rhinitis.   Yes Historical Provider, MD  Multiple Vitamin (MULTIVITAMIN) capsule Take 1 capsule by mouth daily. am   Yes Historical Provider, MD   BP 164/102  Pulse 69  Temp(Src) 98 F (36.7 C) (Oral)  Resp 20  SpO2 98% Physical Exam  Nursing note and vitals reviewed. Constitutional: He is oriented to person, place, and time. He appears well-developed and well-nourished. He appears distressed (uncomfortable appearing).  HENT:  Head: Normocephalic and atraumatic.  Nose: Nose normal.  Mouth/Throat: Oropharynx is clear and moist.  Eyes: Conjunctivae and EOM are normal. Pupils  are equal, round, and reactive to light.  Neck: Normal range of motion. Neck supple. No JVD present. No tracheal deviation present. No thyromegaly present.  Cardiovascular: Normal rate, regular rhythm, normal heart sounds and intact distal pulses.  Exam reveals no gallop and no friction rub.   No murmur heard. Pulmonary/Chest: Effort normal and breath sounds normal. No stridor. No respiratory distress. He has no wheezes. He has no rales. He exhibits no tenderness.  Abdominal: Soft. Bowel sounds are normal. He exhibits no distension and no mass. There is no tenderness (patient claims left-sided abdominal pain, but not reproducible on exam). There is no  rebound and no guarding.  Musculoskeletal: Normal range of motion. He exhibits no edema and no tenderness.  Lymphadenopathy:    He has no cervical adenopathy.  Neurological: He is alert and oriented to person, place, and time. He exhibits normal muscle tone. Coordination normal.  Skin: Skin is warm and dry. No rash noted. No erythema. No pallor.  Psychiatric: He has a normal mood and affect. His behavior is normal. Judgment and thought content normal.    ED Course  Procedures (including critical care time) Labs Review Labs Reviewed  URINALYSIS, ROUTINE W REFLEX MICROSCOPIC - Abnormal; Notable for the following:    Specific Gravity, Urine 1.037 (*)    All other components within normal limits  CBC WITH DIFFERENTIAL - Abnormal; Notable for the following:    MCH 34.4 (*)    All other components within normal limits  BASIC METABOLIC PANEL - Abnormal; Notable for the following:    GFR calc non Af Amer 66 (*)    GFR calc Af Amer 76 (*)    All other components within normal limits    Imaging Review Ct Abdomen Pelvis Wo Contrast  03/11/2014   CLINICAL DATA:  Left flank pain.  History of kidney stones.  EXAM: CT ABDOMEN AND PELVIS WITHOUT CONTRAST  TECHNIQUE: Multidetector CT imaging of the abdomen and pelvis was performed following the standard protocol without IV contrast.  COMPARISON:  Prior radiograph from 12/22/2013  FINDINGS: Scattered atelectatic changes noted within the dependent portions of the visualized lung bases.  The subcentimeter hypodensity within the right hepatic lobe is too small the characterize by CT, but may represent a small cyst. Limited noncontrast evaluation of the liver is otherwise unremarkable. The gallbladder is within normal limits. No biliary ductal dilatation. The spleen, adrenal glands, and pancreas demonstrate a normal unenhanced appearance.  The right kidney is unremarkable without evidence of nephrolithiasis or hydronephrosis. No stones seen along the course of  the right renal collecting system.  On the left, there is an obstructive 3 mm stone at the left UVJ. Secondary mild left hydroureteronephrosis is present. There is an additional nonobstructive 3 mm calculus within the upper pole the left kidney.  Stomach is within normal limits. No evidence of bowel obstruction. Appendix is well visualized in the right lower quadrant and is of normal caliber and appearance without associated inflammatory changes to suggest acute appendicitis. No abnormal wall thickening or inflammatory changes seen about the bowels.  Bladder is decompressed and not well evaluated. Prostate within normal limits.  No free air or fluid. No enlarged intra-abdominal or pelvic lymph nodes. Moderate calcified atheromatous disease seen within the infrarenal aorta and bilateral iliac vessels. No intra-abdominal aneurysm.  No acute osseous abnormality. No worrisome lytic or blastic osseous lesions.  IMPRESSION: 1. 3 mm obstructive stone at the left UVJ with secondary mild left hydroureteronephrosis. 2. Additional 3 mm nonobstructive  stone within the upper pole of the left kidney. 3. No other acute intra-abdominal or pelvic process identified. 4. Moderate calcified atheromatous disease involving the infrarenal aorta and bilateral iliac vessels.   Electronically Signed   By: Jeannine Boga M.D.   On: 03/11/2014 03:50     EKG Interpretation None      MDM   Final diagnoses:  None    53 year old male with left flank pain history of kidney stones.  Urine without blood, but CT scan shows 3 mm stone at UVJ.  Pain is now controlled.  Patient comfortable with discharge home.  Given return precautions.   Kalman Drape, MD 03/11/14 [phone]

## 2014-05-07 ENCOUNTER — Encounter: Payer: Self-pay | Admitting: Neurology

## 2014-05-07 ENCOUNTER — Ambulatory Visit (INDEPENDENT_AMBULATORY_CARE_PROVIDER_SITE_OTHER): Payer: 59 | Admitting: Neurology

## 2014-05-07 VITALS — BP 140/82 | HR 95 | Ht 71.0 in | Wt 199.0 lb

## 2014-05-07 DIAGNOSIS — I10 Essential (primary) hypertension: Secondary | ICD-10-CM | POA: Insufficient documentation

## 2014-05-07 DIAGNOSIS — R569 Unspecified convulsions: Secondary | ICD-10-CM

## 2014-05-07 MED ORDER — DILANTIN 100 MG PO CAPS: 300.0000 mg | ORAL_CAPSULE | Freq: Every day | ORAL | Status: DC

## 2014-05-07 NOTE — Patient Instructions (Signed)
Overall you are doing fairly well but I do want to suggest a few things today:   Remember to drink plenty of fluid, eat healthy meals and do not skip any meals. Try to eat protein with a every meal and eat a healthy snack such as fruit or nuts in between meals. Try to keep a regular sleep-wake schedule and try to exercise daily, particularly in the form of walking, 20-30 minutes a day, if you can.   As far as your medications are concerned, I would like to suggest you continue on the dilantin. A refill was sent.   Please add a daily vitamin D supplement.  I would like to see you back in 1 year, sooner if we need to. Please call us with any interim questions, concerns, problems, updates or refill requests.   My clinical assistant and will answer any of your questions and relay your messages to me and also relay most of my messages to you.   Our phone number is [phone]. We also have an after hours call service for urgent matters and there is a physician on-call for urgent questions. For any emergencies you know to call 911 or go to the nearest emergency room

## 2014-05-07 NOTE — Progress Notes (Signed)
[facility]   Provider:  Dr Janann Colonel Referring Provider: Geoffery Lyons, MD Primary Care Physician:  Eric Lyons, MD  CC:  seizures  HPI:  Eric Craig is a 53 y.o. male here as a follow up. He has a history of seizure disorder. Last visit was 10/2013 , at this time seizures were stable. We attempted to switch him to [place] but he did not tolerate and returned to dilantin. Has been tolerating well. No seizure episodes. Overall doing well. No gait instability. No noted tremor. Neck pain is resolved.    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 Complicated personal history involving L occipital AVM s/o hemorrhage in 1997, basilar artery aneurysm, R temporal craniotomy for aneurysm surgery in 1997, invasive loop procedure for AVM treatment + removal 1997. He has a R homonymous hemianopsia. Has recurrent cervical radiculoapthy s/p multiple laminectomies and fusions of C4-5, C5-6 and C6-7. On 11/12/2011 developed hot flashes, dizziness and difficulty swallowing, next day felt drunk, slurred speech, could not walk, noted L facial weakness, ptosis and numbness. CT head showed no acute processes. 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    Concerns/Questions:Review of Systems: Out of a complete 14 system review, the patient complains of only the following symptoms, and all other reviewed systems are negative. Denies any positive review of systems  History   Social History  . Marital Status: Single    Spouse Name: N/A    Number of Children: 2  . Years of Education: 12   Occupational History  . [place] History Main Topics  . Smoking status: Former Smoker -- 1.00 packs/day for 40 years    Types: Cigarettes  . Smokeless tobacco: Never Used     Comment: Quit three years  ago.  . Alcohol Use: Yes     Comment: Drinks 12 beers per year  . Drug Use: No  . Sexual Activity: Not on file   Other Topics Concern  . Not on file   Social History Narrative   Lives at home with his wife (cell phone number [phone]). Has 2 daughters that accompany him here today. Patient works full time at  [employer].   Patient is left handed   Education level is some vocational college   Caffeine consumption is 4 cups daily    Family History  Problem Relation Age of Onset  . Aortic aneurysm Mother 26  . Hypertension Father   . Heart disease Other     Past Medical History  Diagnosis Date  . 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 Seizures     Last seizure was 2001  . Cervical radiculopathy     recurrent  . Headache(784.0)   . Hx of migraine headaches   . Malformation     occipital arteriovenous  . Hemianopsia     residual right homoinmous  . 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 High blood pressure     Past Surgical History  Procedure Laterality Date  . Neck surgery      C4-5 repair in 1989. C6-7 repair in 1993 and 2001. Residual left arm weakness  . Brain surgery      Basilar artery aneurysm repair in 1997, with facial reconstruction  in 1998  . Right temporal craniotomy  2/97    basilarr artery aneurysm  . Invasive loop procedure  [place] for treatment of the AVM   . Removal of the arteriovenous malformation  1997    [place]  . C5-c6 laminectomy  1/89  . Cervical discectomy  6/89    C5-C6-7:04/1992, C4-5, w/ plate fusion for left C5 radiculopathy 12/00    Current Outpatient Prescriptions  Medication Sig Dispense Refill  . calcium carbonate (OS-CAL) 600 MG TABS Take 600 mg by mouth daily.      Marland Kitchen DILANTIN 100 MG ER capsule Take 3 capsules (300 mg total) by mouth daily. Do not mail until patient calls  270 capsule  6  . fluticasone (FLONASE) 50 MCG/ACT nasal spray Place 1 spray into  both nostrils daily as needed for allergies or rhinitis.      Marland Kitchen losartan (COZAAR) 50 MG tablet 50 mg daily.      . Multiple Vitamin (MULTIVITAMIN) capsule Take 1 capsule by mouth daily. am      . oxyCODONE-acetaminophen (PERCOCET/ROXICET) 5-325 MG per tablet Take 2 tablets by mouth every 4 (four) hours as needed for severe pain.  20 tablet  0   No current facility-administered medications for this visit.    Allergies as of 05/07/2014  . (No Known Allergies)    Vitals: BP 140/82  Pulse 95  Ht 5\' 11"  (1.803 m)  Wt 199 lb (90.266 kg)  BMI 27.77 kg/m2 Last Weight:  Wt Readings from Last 1 Encounters:  05/07/14 199 lb (90.266 kg)   Last Height:   Ht Readings from Last 1 Encounters:  05/07/14 5\' 11"  (1.803 m)     Physical exam: Gen: NAD, conversant  Eyes: anicteric sclerae, moist conjunctivae  HENT: Atraumati  Neck: Trachea midline; supple,  Lungs: CTA, no wheezing, rales, rhonic  CV: RRR, no MRG  Abdomen: Soft, non-tender;  Extremities: No peripheral edema  Skin: Normal temperature, no rash,  Psych: Appropriate affect, pleasant  Neuro:  MS: AA&Ox3, appropriately interactive, normal affect   Speech: fluent w/o paraphasic error   Memory: good recent and remote recall   CN:  PERRL, R inferior > superior hemianopsia,EOMI w/ downbeating nystagmus, no ptosis, sensation intact to LT V1-V3 bilat, face symmetric, no weakness, hearing grossly intact, palate elevates symmetrically, shoulder shrug 5/5 bilat,  tongue protrudes midline, no fasiculations noted.  Motor:  Noted R laterocolis, L torticolis, tenderness/pain with palpation in L trapezius and R trapezius  noted atrophy LUE and LLE  Strength:  5/5 In all extremities except weakness in LUE  Coord: mild end point intention tremor bilat hands R>L  Reflexes: symmetrical, bilat downgoing toes  Sens: LT intact in all extremities  Gait: posture, stance, stride and arm-swing normal. Unable to tandem. Able to walk on heels and  toes. Romberg negative.     Assessment:  After physical and neurologic examination, review of laboratory studies, imaging, neurophysiology testing and pre-existing records, assessment will be reviewed on the problem list.  Plan:  Treatment plan and additional workup will be reviewed under Problem List.  1)Seizures 2)Cervical Dystonia 3)CVA/aneursym history  Mr Enfield is a pleasant 53 y/o gentleman with a complicated history including seizure disorder, multiple vascular events/strokes, cervical surgeries presenting for follow up. Clinically appears stable, he did not tolerate the transition to keppra so he is back on dilantin 300mg  qhs. Tolerating it well, no further seizure episodes. Physical exam is overall stable. No signs of dilantin toxicity. Refill  of dilantin given. Patient instructed to take daily vitamin D supplement. Follow up in 12 months, earlier if needed. t   Jim Like, DO  [facility] [address] [place], [place] [address]  Phone [phone] Fax [phone]

## 2014-07-27 ENCOUNTER — Ambulatory Visit (INDEPENDENT_AMBULATORY_CARE_PROVIDER_SITE_OTHER): Payer: 59 | Admitting: Family Medicine

## 2014-07-27 ENCOUNTER — Ambulatory Visit (INDEPENDENT_AMBULATORY_CARE_PROVIDER_SITE_OTHER): Payer: 59

## 2014-07-27 VITALS — BP 150/90 | HR 97 | Temp 98.0°F | Resp 18 | Ht 71.0 in | Wt 199.0 lb

## 2014-07-27 DIAGNOSIS — R002 Palpitations: Secondary | ICD-10-CM

## 2014-07-27 DIAGNOSIS — R05 Cough: Secondary | ICD-10-CM

## 2014-07-27 DIAGNOSIS — J22 Unspecified acute lower respiratory infection: Secondary | ICD-10-CM

## 2014-07-27 DIAGNOSIS — Z9889 Other specified postprocedural states: Secondary | ICD-10-CM

## 2014-07-27 DIAGNOSIS — R42 Dizziness and giddiness: Secondary | ICD-10-CM

## 2014-07-27 DIAGNOSIS — R509 Fever, unspecified: Secondary | ICD-10-CM

## 2014-07-27 DIAGNOSIS — Z8673 Personal history of transient ischemic attack (TIA), and cerebral infarction without residual deficits: Secondary | ICD-10-CM

## 2014-07-27 DIAGNOSIS — Z8679 Personal history of other diseases of the circulatory system: Secondary | ICD-10-CM

## 2014-07-27 DIAGNOSIS — R059 Cough, unspecified: Secondary | ICD-10-CM

## 2014-07-27 DIAGNOSIS — J988 Other specified respiratory disorders: Secondary | ICD-10-CM

## 2014-07-27 LAB — POCT CBC
GRANULOCYTE PERCENT: 71.3 % (ref 37–80)
HCT, POC: 47.4 % (ref 43.5–53.7)
HEMOGLOBIN: 15.6 g/dL (ref 14.1–18.1)
Lymph, poc: 1 (ref 0.6–3.4)
MCH: 33.6 pg — AB (ref 27–31.2)
MCHC: 32.8 g/dL (ref 31.8–35.4)
MCV: 102.3 fL — AB (ref 80–97)
MID (CBC): 0.3 (ref 0–0.9)
MPV: 6.9 fL (ref 0–99.8)
PLATELET COUNT, POC: 150 10*3/uL (ref 142–424)
POC Granulocyte: 3.3 (ref 2–6.9)
POC LYMPH PERCENT: 21.5 %L (ref 10–50)
POC MID %: 7.2 %M (ref 0–12)
RBC: 4.64 M/uL — AB (ref 4.69–6.13)
RDW, POC: 14.3 %
WBC: 4.6 10*3/uL (ref 4.6–10.2)

## 2014-07-27 LAB — GLUCOSE, POCT (MANUAL RESULT ENTRY): POC Glucose: 93 mg/dl (ref 70–99)

## 2014-07-27 MED ORDER — AZITHROMYCIN 250 MG PO TABS: ORAL_TABLET | ORAL | Status: DC

## 2014-07-27 NOTE — Progress Notes (Addendum)
Subjective:    Patient ID: ZYDEN SUMAN, male    DOB: 06/24/1961, 53 y.o.   MRN: [id number]  This chart was scribed for Merri Ray, MD by Edison Simon, ED Scribe. This patient was seen in room 11 and the patient's care was started at 8:30 AM.   Chief Complaint  Patient presents with  . Dizziness    in the evenings, very dizzy feels like he could fall down, fever, no nausea x 2 nights  . Cough   HPI  HPI Comments: KAIZER DISSINGER is a 53 y.o. 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 It appears his last visit with neurology was July of this year, continued on Dilantin. He presents today complaining of dizziness and fever measured at 101 with onset 2 days ago. He states he feels fine when he wakes up in the morning, but starts feeling dizzy around lunchtime that resolves after going to bed at night. He describes his dizziness as feeling unsteady with walking along with tinnitus upon turning his head. He denies associated focal weakness, slurred speech, or vision changes. He also reports fevers at night. He reports experiencing tachycardia last night but states his heart was not pounding - thinks might be from anxious due to dizziness. He also reports a dry cough with onset 2 days ago; he reports contact with a coworker who has walking pneumonia for 1.5-2 weeks. He denies having similar symptoms previously. He states he is compliant with his Dilantin. He reports peripheral vision loss since his aneurysm, but he denies recent changes. He denies recent seizure. He denies headache, nausea, vomiting, or chest pain.  Clinical biochemist.    Patient Active Problem List   Diagnosis Date Noted  . High blood pressure   . 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 Stroke 11/13/2011  . Seizure 11/13/2011   Past Medical History  Diagnosis Date  . Aneurysm of anterior cerebral artery     Basilar artery  aneurysm repair in 1997  . Seizures     Last seizure was 2001  . Cervical radiculopathy     recurrent  . Headache(784.0)   . Hx of migraine headaches   . Malformation     occipital arteriovenous  . Hemianopsia     residual right homoinmous  . 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 High blood pressure    Past Surgical History  Procedure Laterality Date  . Neck surgery      C4-5 repair in 1989. C6-7 repair in 1993 and 2001. Residual left arm weakness  . Brain surgery      Basilar artery aneurysm repair in 1997, with facial reconstruction in 1998  . Right temporal craniotomy  2/97    basilarr artery aneurysm  . Invasive loop procedure  [place] for treatment of the AVM   . Removal of the arteriovenous malformation  1997    [place]  . C5-c6 laminectomy  1/89  . Cervical discectomy  6/89    C5-C6-7:04/1992, C4-5, w/ plate fusion for left C5 radiculopathy 12/00   No Known Allergies Prior to Admission medications   Medication Sig Start Date End Date Taking? Authorizing Provider  calcium carbonate (OS-CAL) 600 MG TABS Take 600 mg by mouth daily.   Yes Historical Provider, MD  DILANTIN 100 MG ER capsule Take 3 capsules (300 mg total) by mouth daily. Do not  mail until patient calls 05/07/14  Yes Hulen Luster, DO  losartan (COZAAR) 50 MG tablet 50 mg daily. 04/30/14  Yes Historical Provider, MD  Multiple Vitamin (MULTIVITAMIN) capsule Take 1 capsule by mouth daily. am   Yes Historical Provider, MD  fluticasone (FLONASE) 50 MCG/ACT nasal spray Place 1 spray into both nostrils daily as needed for allergies or rhinitis.    Historical Provider, MD  oxyCODONE-acetaminophen (PERCOCET/ROXICET) 5-325 MG per tablet Take 2 tablets by mouth every 4 (four) hours as needed for severe pain. 03/11/14   Kalman Drape, MD   History   Social History  . Marital Status: Single    Spouse Name: N/A    Number of Children: 2  . Years of Education: 12   Occupational History  .  [place] History Main Topics  . Smoking status: Former Smoker -- 1.00 packs/day for 40 years    Types: Cigarettes  . Smokeless tobacco: Never Used     Comment: Quit three years ago.  . Alcohol Use: Yes     Comment: Drinks 12 beers per year  . Drug Use: No  . Sexual Activity: Not on file   Other Topics Concern  . Not on file   Social History Narrative   Lives at home with his wife (cell phone number [phone]). Has 2 daughters that accompany him here today. Patient works full time at  [employer].   Patient is left handed   Education level is some vocational college   Caffeine consumption is 4 cups daily      Review of Systems  Constitutional: Positive for fever.  HENT: Positive for tinnitus.   Eyes: Negative for visual disturbance.  Respiratory: Positive for cough.   Cardiovascular: Positive for palpitations. Negative for chest pain.       Positive tachycardia  Gastrointestinal: Negative for nausea and vomiting.  Neurological: Positive for dizziness. Negative for seizures, speech difficulty, weakness and headaches.       Objective:     Physical Exam  Nursing note and vitals reviewed. Constitutional: He is oriented to person, place, and time. He appears well-developed and well-nourished.  HENT:  Head: Normocephalic and atraumatic.  Right Ear: Tympanic membrane, external ear and ear canal normal.  Left Ear: Tympanic membrane, external ear and ear canal normal.  Nose: No rhinorrhea.  Mouth/Throat: Oropharynx is clear and moist and mucous membranes are normal. No oropharyngeal exudate or posterior oropharyngeal erythema.  Equal palate elevation, moist mucosa. On ear exam, no apparent effusion, no erythema of the TM  Eyes: Conjunctivae and EOM are normal. Pupils are equal, round, and reactive to light. Left eye exhibits no nystagmus.  Neck: Neck supple. No JVD present. Carotid bruit is not present.  Cardiovascular: Normal rate, regular  rhythm, normal heart sounds and intact distal pulses.   No murmur heard. Pulmonary/Chest: Effort normal and breath sounds normal. He has no wheezes. He has no rhonchi. He has no rales.  Abdominal: Soft. There is no tenderness.  Musculoskeletal: He exhibits no edema.  Lymphadenopathy:    He has no cervical adenopathy.  Neurological: He is alert and oriented to person, place, and time.  Unsteady to walk heel to toe which he reports to be his baseline with previous aneurism, negative Romberg, no pronator drift  Skin: Skin is warm and dry. No rash noted.  Psychiatric: He has a normal mood and affect. His behavior is normal.    Filed Vitals:   07/27/14 0816  BP:  150/90  Pulse: 97  Temp: 98 F (36.7 C)  Resp: 18  Height: 5\' 11"  (1.803 m)  Weight: 199 lb (90.266 kg)  SpO2: 96%   [facility] preliminary chest x-ray report read by Dr. Carlota Raspberry. Increased right lower lobe markings with possible faint infiltrate. On lateral view, inferior markings/infiltrate.  EKG read by me. Sinus rhythm, no acute findings   Results for orders placed in visit on 07/27/14  POCT CBC      Result Value Ref Range   WBC 4.6  4.6 - 10.2 K/uL   Lymph, poc 1.0  0.6 - 3.4   POC LYMPH PERCENT 21.5  10 - 50 %L   MID (cbc) 0.3  0 - 0.9   POC MID % 7.2  0 - 12 %M   POC Granulocyte 3.3  2 - 6.9   Granulocyte percent 71.3  37 - 80 %G   RBC 4.64 (*) 4.69 - 6.13 M/uL   Hemoglobin 15.6  14.1 - 18.1 g/dL   HCT, POC 47.4  43.5 - 53.7 %   MCV 102.3 (*) 80 - 97 fL   MCH, POC 33.6 (*) 27 - 31.2 pg   MCHC 32.8  31.8 - 35.4 g/dL   RDW, POC 14.3     Platelet Count, POC 150  142 - 424 K/uL   MPV 6.9  0 - 99.8 fL       Assessment & Plan:  JADIEL SCHMIEDER is a 53 y.o. male Cough - Plan: POCT CBC, DG Chest 2 View, azithromycin (ZITHROMAX) 250 MG tablet  Fever, unspecified fever cause - Plan: POCT CBC, DG Chest 2 View  Palpitations - Plan: EKG 12-Lead  Dizziness - Plan: EKG 12-Lead, POCT glucose (manual entry)  History of  cerebral aneurysm repair  History of stroke  LRTI (lower respiratory tract infection) - Plan: azithromycin (ZITHROMAX) 250 MG tablet  Cough with fever possible viral illness, but sick contact with pneumonia and some increased markings on CXR (radiology over read without acute findings - COPD/basilar scarring).  Afebrile in office and reassuring CBC. Dizziness in afternoon only, with clearing in am, and nonfocal exam in office (previous aneurysm repair, stroke and seizure disorder.  No seizure activity, and residual hemianopsia, difficulty with heel to toe from prior stroke - no new neurologic symptoms or deficit in office this morning). Discussed with neurologist on call. As symptoms clear in am, and present for 2-3 days now, unlikely acute neurologic insult. Recommended outpatient follow up with neuro, +/- imaging.   -discussed CT brain today (unable to do MRI as metal clips from aneurysm repair). He did not want to have CT today - would like to hold off for a few days, then if not improving - agreeable to imaging in a few days.   -mucinex as needed for cough. If fevers persist into tomorrow - can start [place] - printed.   -out of work for next 3 days d//t dizziness, then will try to have seen by neurology Monday.   -ER/911 precautions if any new neurologic symptoms or worsening of current symptoms.   Meds ordered this encounter  Medications  . azithromycin (ZITHROMAX) 250 MG tablet    Sig: Take 2 pills by mouth on day 1, then 1 pill by mouth per day on days 2 through 5.    Dispense:  6 tablet    Refill:  0   Patient Instructions  Mucinex for cough, if cough and fever not improving in the next 1-2 days, can fill  Z-pack. Out of work until seen by neurology on Monday. We will call to try to get this scheduled and will let you know about appointment. If you have any new or worsening symptoms in the next few days, go to the emergency room.      I personally performed the services described in  this documentation, which was scribed in my presence. The recorded information has been reviewed and considered, and addended by me as needed.

## 2014-07-27 NOTE — Patient Instructions (Signed)
Mucinex for cough, if cough and fever not improving in the next 1-2 days, can fill Z-pack. Out of work until seen by neurology on Monday. We will call to try to get this scheduled and will let you know about appointment. If you have any new or worsening symptoms in the next few days, go to the emergency room.

## 2014-07-30 ENCOUNTER — Institutional Professional Consult (permissible substitution): Payer: 59 | Admitting: Neurology

## 2014-12-17 ENCOUNTER — Encounter (HOSPITAL_COMMUNITY): Payer: Self-pay | Admitting: Emergency Medicine

## 2014-12-17 ENCOUNTER — Observation Stay (HOSPITAL_COMMUNITY): Payer: 59

## 2014-12-17 ENCOUNTER — Emergency Department (HOSPITAL_COMMUNITY): Payer: 59

## 2014-12-17 ENCOUNTER — Observation Stay (HOSPITAL_COMMUNITY)
Admission: EM | Admit: 2014-12-17 | Discharge: 2014-12-19 | Disposition: A | Discharge status: Home / Self Care | Payer: 59 | Attending: Internal Medicine | Admitting: Internal Medicine

## 2014-12-17 DIAGNOSIS — H53461 Homonymous bilateral field defects, right side: Secondary | ICD-10-CM | POA: Diagnosis present

## 2014-12-17 DIAGNOSIS — Z87891 Personal history of nicotine dependence: Secondary | ICD-10-CM | POA: Insufficient documentation

## 2014-12-17 DIAGNOSIS — R42 Dizziness and giddiness: Secondary | ICD-10-CM | POA: Diagnosis not present

## 2014-12-17 DIAGNOSIS — Z8673 Personal history of transient ischemic attack (TIA), and cerebral infarction without residual deficits: Secondary | ICD-10-CM

## 2014-12-17 DIAGNOSIS — Z9889 Other specified postprocedural states: Secondary | ICD-10-CM

## 2014-12-17 DIAGNOSIS — R269 Unspecified abnormalities of gait and mobility: Secondary | ICD-10-CM | POA: Diagnosis present

## 2014-12-17 DIAGNOSIS — M5412 Radiculopathy, cervical region: Secondary | ICD-10-CM | POA: Insufficient documentation

## 2014-12-17 DIAGNOSIS — I10 Essential (primary) hypertension: Secondary | ICD-10-CM | POA: Diagnosis not present

## 2014-12-17 DIAGNOSIS — Z79899 Other long term (current) drug therapy: Secondary | ICD-10-CM | POA: Insufficient documentation

## 2014-12-17 DIAGNOSIS — I639 Cerebral infarction, unspecified: Secondary | ICD-10-CM

## 2014-12-17 DIAGNOSIS — G40909 Epilepsy, unspecified, not intractable, without status epilepticus: Secondary | ICD-10-CM | POA: Diagnosis not present

## 2014-12-17 DIAGNOSIS — Z8679 Personal history of other diseases of the circulatory system: Secondary | ICD-10-CM

## 2014-12-17 LAB — COMPREHENSIVE METABOLIC PANEL
ALBUMIN: 4.4 g/dL (ref 3.5–5.2)
ALT: 25 U/L (ref 0–53)
AST: 20 U/L (ref 0–37)
Alkaline Phosphatase: 114 U/L (ref 39–117)
Anion gap: 9 (ref 5–15)
BILIRUBIN TOTAL: 0.8 mg/dL (ref 0.3–1.2)
BUN: 12 mg/dL (ref 6–23)
CO2: 27 mmol/L (ref 19–32)
CREATININE: 0.93 mg/dL (ref 0.50–1.35)
Calcium: 9.2 mg/dL (ref 8.4–10.5)
Chloride: 103 mmol/L (ref 96–112)
GFR calc non Af Amer: >90 mL/min (ref >90)
Glucose, Bld: 96 mg/dL (ref 70–99)
POTASSIUM: 3.8 mmol/L (ref 3.5–5.1)
Sodium: 139 mmol/L (ref 135–145)
Total Protein: 7.1 g/dL (ref 6.0–8.3)

## 2014-12-17 LAB — CBC
HEMATOCRIT: 43 % (ref 39.0–52.0)
Hemoglobin: 15.1 g/dL (ref 13.0–17.0)
MCH: 33.6 pg (ref 26.0–34.0)
MCHC: 35.1 g/dL (ref 30.0–36.0)
MCV: 95.8 fL (ref 78.0–100.0)
Platelets: 184 10*3/uL (ref 150–400)
RBC: 4.49 MIL/uL (ref 4.22–5.81)
RDW: 13 % (ref 11.5–15.5)
WBC: 4 10*3/uL (ref 4.0–10.5)

## 2014-12-17 LAB — DIFFERENTIAL
Basophils Absolute: 0 10*3/uL (ref 0.0–0.1)
Basophils Relative: 0 % (ref 0–1)
EOS ABS: 0 10*3/uL (ref 0.0–0.7)
Eosinophils Relative: 0 % (ref 0–5)
LYMPHS ABS: 1.2 10*3/uL (ref 0.7–4.0)
Lymphocytes Relative: 29 % (ref 12–46)
MONOS PCT: 8 % (ref 3–12)
Monocytes Absolute: 0.3 10*3/uL (ref 0.1–1.0)
Neutro Abs: 2.5 10*3/uL (ref 1.7–7.7)
Neutrophils Relative %: 63 % (ref 43–77)

## 2014-12-17 LAB — PROTIME-INR
INR: 0.95 (ref 0.00–1.49)
Prothrombin Time: 12.8 seconds (ref 11.6–15.2)

## 2014-12-17 LAB — PHENYTOIN LEVEL, TOTAL: Phenytoin Lvl: 18.7 ug/mL (ref 10.0–20.0)

## 2014-12-17 LAB — I-STAT TROPONIN, ED: Troponin i, poc: 0 ng/mL (ref 0.00–0.08)

## 2014-12-17 LAB — CBG MONITORING, ED: Glucose-Capillary: 80 mg/dL (ref 70–99)

## 2014-12-17 LAB — APTT: aPTT: 28 seconds (ref 24–37)

## 2014-12-17 MED ORDER — FLUTICASONE PROPIONATE 50 MCG/ACT NA SUSP
1.0000 | Freq: Every day | NASAL | Status: DC | PRN
  Filled 2014-12-17: qty 16

## 2014-12-17 MED ORDER — MULTIVITAMINS PO CAPS: 1.0000 | ORAL_CAPSULE | Freq: Every day | ORAL | Status: DC

## 2014-12-17 MED ORDER — AMLODIPINE BESYLATE 5 MG PO TABS
5.0000 mg | ORAL_TABLET | Freq: Every day | ORAL | Status: DC
  Administered 2014-12-18 – 2014-12-19 (×2): 5 mg via ORAL
  Filled 2014-12-17 (×2): qty 1

## 2014-12-17 MED ORDER — PHENYTOIN SODIUM EXTENDED 100 MG PO CAPS
300.0000 mg | ORAL_CAPSULE | Freq: Every day | ORAL | Status: DC
  Administered 2014-12-18 – 2014-12-19 (×2): 300 mg via ORAL
  Filled 2014-12-17 (×2): qty 3

## 2014-12-17 MED ORDER — SODIUM CHLORIDE 0.9 % IV BOLUS (SEPSIS)
1000.0000 mL | Freq: Once | INTRAVENOUS | Status: AC
  Administered 2014-12-17: 1000 mL via INTRAVENOUS

## 2014-12-17 MED ORDER — ADULT MULTIVITAMIN W/MINERALS CH
1.0000 | ORAL_TABLET | Freq: Every day | ORAL | Status: DC
  Administered 2014-12-18 – 2014-12-19 (×2): 1 via ORAL
  Filled 2014-12-17 (×2): qty 1

## 2014-12-17 MED ORDER — HEPARIN SODIUM (PORCINE) 5000 UNIT/ML IJ SOLN: 5000.0000 [IU] | Freq: Three times a day (TID) | INTRAMUSCULAR | Status: DC

## 2014-12-17 MED ORDER — OXYCODONE-ACETAMINOPHEN 5-325 MG PO TABS
2.0000 | ORAL_TABLET | ORAL | Status: DC | PRN
Start: 2014-12-17 — End: 2014-12-19

## 2014-12-17 MED ORDER — LOSARTAN POTASSIUM 50 MG PO TABS
100.0000 mg | ORAL_TABLET | Freq: Every day | ORAL | Status: DC
  Administered 2014-12-18 – 2014-12-19 (×2): 100 mg via ORAL
  Filled 2014-12-17 (×2): qty 2

## 2014-12-17 MED ORDER — CALCIUM CARBONATE 600 MG PO TABS: 600.0000 mg | ORAL_TABLET | Freq: Every day | ORAL | Status: DC

## 2014-12-17 MED ORDER — CALCIUM CARBONATE 1250 (500 CA) MG PO TABS
1.0000 | ORAL_TABLET | Freq: Every day | ORAL | Status: DC
  Administered 2014-12-18 – 2014-12-19 (×2): 500 mg via ORAL
  Filled 2014-12-17 (×2): qty 1

## 2014-12-17 MED ORDER — STROKE: EARLY STAGES OF RECOVERY BOOK
Freq: Once | Status: AC
  Administered 2014-12-17: 20:00:00

## 2014-12-17 NOTE — ED Notes (Signed)
MD at bedside. 

## 2014-12-17 NOTE — ED Notes (Addendum)
Report attempted. Charge nurse states she will call me back.

## 2014-12-17 NOTE — ED Notes (Signed)
Family at bedside. 

## 2014-12-17 NOTE — Consult Note (Signed)
Referring Physician: Darl Householder    Chief Complaint: Drifting to the left when walking  HPI:                                                                                                                                         Eric Craig is an 54 y.o. 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 Complicated personal history involving L occipital AVM s/o hemorrhage in 1997, basilar artery aneurysm, R temporal craniotomy for aneurysm surgery in 1997, invasive loop procedure for AVM treatment + removal 1997. He has a R homonymous hemianopsia. Patient has had on and off spells of feeling light headed but this AM he noted he stood up and when he started walking he felt as though he was drifting to the left.  Over a period of time this resolved and currently he is asymptomatic. HE has a known left hypoplastic/occluded vertebral artery--due to this he came to ED to be evaluated.  Denies HA, double vision, difficulty swallowing, slurred speech, language or vision impairment.  Date last known well: Date: 12/16/2014 Time last known well: Unable to determine tPA Given: No: out of window  Modified Rankin: Rankin Score=0    Past Medical History  Diagnosis Date  . 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 Seizures     Last seizure was 2001  . Cervical radiculopathy     recurrent  . Headache(784.0)   . Hx of migraine headaches   . Malformation     occipital arteriovenous  . Hemianopsia     residual right homoinmous  . 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 High blood pressure     Past Surgical History  Procedure Laterality Date  . Neck surgery      C4-5 repair in 1989. C6-7 repair in 1993 and 2001. Residual left arm weakness  . Brain surgery      Basilar artery aneurysm repair in 1997, with facial reconstruction in 1998  . Right temporal craniotomy  2/97    basilarr artery aneurysm  . Invasive loop  procedure  [place] for treatment of the AVM   . Removal of the arteriovenous malformation  1997    [place]  . C5-c6 laminectomy  1/89  . Cervical discectomy  6/89    C5-C6-7:04/1992, C4-5, w/ plate fusion for left C5 radiculopathy 12/00    Family History  Problem Relation Age of Onset  . Aortic aneurysm Mother 8  . Hypertension Father   . Heart disease Other    Social History:  reports that he has quit smoking. His smoking use included Cigarettes. He has a 40 pack-year smoking history. He has never used smokeless tobacco. He reports that he drinks alcohol. He reports that he does not use illicit drugs. Family  history: no epilepsy, MS, or brain tumors. Allergies: No Known Allergies  Medications:                                                                                                                           No current facility-administered medications for this encounter.   Current Outpatient Prescriptions  Medication Sig Dispense Refill  . amLODipine (NORVASC) 5 MG tablet Take 5 mg by mouth daily.    . calcium carbonate (OS-CAL) 600 MG TABS Take 600 mg by mouth daily.    Marland Kitchen DILANTIN 100 MG ER capsule Take 3 capsules (300 mg total) by mouth daily. Do not mail until patient calls 270 capsule 6  . fluticasone (FLONASE) 50 MCG/ACT nasal spray Place 1 spray into both nostrils daily as needed for allergies or rhinitis.    Marland Kitchen losartan (COZAAR) 50 MG tablet Take 100 mg by mouth daily.     . Multiple Vitamin (MULTIVITAMIN) capsule Take 1 capsule by mouth daily. am    . oxyCODONE-acetaminophen (PERCOCET/ROXICET) 5-325 MG per tablet Take 2 tablets by mouth every 4 (four) hours as needed for severe pain. 20 tablet 0  . azithromycin (ZITHROMAX) 250 MG tablet Take 2 pills by mouth on day 1, then 1 pill by mouth per day on days 2 through 5. (Patient not taking: Reported on 12/17/2014) 6 tablet 0     ROS:                                                                                                                                        History obtained from the patient  General ROS: negative for - chills, fatigue, fever, night sweats, weight gain or weight loss Psychological ROS: negative for - behavioral disorder, hallucinations, memory difficulties, mood swings or suicidal ideation Ophthalmic ROS: negative for - blurry vision, double vision, eye pain or loss of vision ENT ROS: negative for - epistaxis, nasal discharge, oral lesions, sore throat, tinnitus or vertigo Allergy and Immunology ROS: negative for - hives or itchy/watery eyes Hematological and Lymphatic ROS: negative for - bleeding problems, bruising or swollen lymph nodes Endocrine ROS: negative for - galactorrhea, hair pattern changes, polydipsia/polyuria or temperature intolerance Respiratory ROS: negative for - cough, hemoptysis, shortness of breath or wheezing Cardiovascular ROS: negative for - chest pain, dyspnea on exertion, edema or irregular heartbeat Gastrointestinal ROS: negative for - abdominal pain,  diarrhea, hematemesis, nausea/vomiting or stool incontinence Genito-Urinary ROS: negative for - dysuria, hematuria, incontinence or urinary frequency/urgency Musculoskeletal ROS: negative for - joint swelling or muscular weakness Neurological ROS: as noted in HPI Dermatological ROS: negative for rash and skin lesion changes  Physical Examination:                                                                                                      Blood pressure 114/83, pulse 76, temperature 98.5 F (36.9 C), resp. rate 11, height 5\' 11"  (1.803 m), weight 86.268 kg (190 lb 3 oz), SpO2 96 %.  HEENT-  Normocephalic, no lesions, without obvious abnormality.  Normal external eye and conjunctiva.  Normal TM's bilaterally.  Normal auditory canals and external ears. Normal external nose, mucus membranes and septum.  Normal pharynx. Cardiovascular- S1, S2 normal, pulses palpable throughout   Lungs- chest  clear, no wheezing, rales, normal symmetric air entry Abdomen- normal findings: bowel sounds normal Extremities- no edema Lymph-no adenopathy palpable Musculoskeletal-no joint tenderness, deformity or swelling Skin-warm and dry, no hyperpigmentation, vitiligo, or suspicious lesions  Neurological Examination Mental Status: Alert, oriented, thought content appropriate.  Speech fluent without evidence of aphasia.  Able to follow 3 step commands without difficulty. Cranial Nerves: II: Discs flat bilaterally; right hemianopsia, pupils equal, round, reactive to light and accommodation III,IV, VI: ptosis not present, extra-ocular motions intact bilaterally V,VII: smile symmetric, facial light touch sensation normal bilaterally VIII: hearing normal bilaterally IX,X: uvula rises symmetrically XI: bilateral shoulder shrug XII: midline tongue extension Motor: Right : Upper extremity   5/5    Left:     Upper extremity   5/5  Lower extremity   5/5     Lower extremity   5/5 Tone and bulk:normal tone throughout; no atrophy noted Sensory: Pinprick and light touch intact throughout, bilaterally Deep Tendon Reflexes: 2+ and symmetric throughout Plantars: Right: downgoing   Left: downgoing Cerebellar: normal finger-to-nose,  and normal heel-to-shin test Gait: normal gait and station    Lab Results: Basic Metabolic Panel:  Recent Labs Lab 12/17/14 1329  NA 139  K 3.8  CL 103  CO2 27  GLUCOSE 96  BUN 12  CREATININE 0.93  CALCIUM 9.2    Liver Function Tests:  Recent Labs Lab 12/17/14 1329  AST 20  ALT 25  ALKPHOS 114  BILITOT 0.8  PROT 7.1  ALBUMIN 4.4   No results for input(s): LIPASE, AMYLASE in the last 168 hours. No results for input(s): AMMONIA in the last 168 hours.  CBC:  Recent Labs Lab 12/17/14 1329  WBC 4.0  NEUTROABS 2.5  HGB 15.1  HCT 43.0  MCV 95.8  PLT 184    Cardiac Enzymes: No results for input(s): CKTOTAL, CKMB, CKMBINDEX, TROPONINI in the last  168 hours.  Lipid Panel: No results for input(s): CHOL, TRIG, HDL, CHOLHDL, VLDL, LDLCALC in the last 168 hours.  CBG:  Recent Labs Lab 12/17/14 1406  GLUCAP 80    Microbiology: No results found for this or any previous visit.  Coagulation Studies:  Recent Labs  12/17/14 1329  LABPROT 12.8  INR 0.95    Imaging: No results found.     Assessment and plan discussed with with attending physician and they are in agreement.    Etta Quill PA-C [facility] [phone]  12/17/2014, 3:05 PM   Assessment: 54 y.o. male presenting to hospital after waking up and noting he was drifting to the left. Current exam is only shows right hemianopsia (old). CT head shows no acute bleed.  Given history of HTN and prior CVA patient would benefit from TIA/CVA work up.   Stroke Risk Factors - hypertension  Recommend: 1. HgbA1c, fasting lipid panel 2. MRI, MRA  of the brain without contrast 3. PT consult, OT consult, Speech consult 4. Echocardiogram 5. Carotid dopplers 6. Prophylactic therapy-Antiplatelet med: Aspirin - dose 325 mg daily 7. Risk factor modification 8. Telemetry monitoring 9. Frequent neuro checks 10 NPO until passes stroke swallow screen  Patient seen and examined together with physician assistant and I concur with the assessment and plan.  Dorian Pod, MD

## 2014-12-17 NOTE — ED Provider Notes (Signed)
CSN: [id number]     Arrival date & time 12/17/14  1304 History   First MD Initiated Contact with Patient 12/17/14 1353     Chief Complaint  Patient presents with  . Dizziness     (Consider location/radiation/quality/duration/timing/severity/associated sxs/prior Treatment) HPI Patient presents to the emergency department with off balance gait and dizziness.  The patient states that he woke up with these symptoms.  The patient states they have persisted throughout the day.  The patient states that nothing seems make his condition, better or worse.  The patient states that he has had a mini stroke in the past.  Patient denies chest pain, shortness of breath, nausea, vomiting, weakness, headache, blurred vision, neck pain, fever, cough, rash, lightheadedness, near syncope or syncope Past Medical History  Diagnosis Date  . 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 Seizures     Last seizure was 2001  . Cervical radiculopathy     recurrent  . Headache(784.0)   . Hx of migraine headaches   . Malformation     occipital arteriovenous  . Hemianopsia     residual right homoinmous  . 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 High blood pressure    Past Surgical History  Procedure Laterality Date  . Neck surgery      C4-5 repair in 1989. C6-7 repair in 1993 and 2001. Residual left arm weakness  . Brain surgery      Basilar artery aneurysm repair in 1997, with facial reconstruction in 1998  . Right temporal craniotomy  2/97    basilarr artery aneurysm  . Invasive loop procedure  [place] for treatment of the AVM   . Removal of the arteriovenous malformation  1997    [place]  . C5-c6 laminectomy  1/89  . Cervical discectomy  6/89    C5-C6-7:04/1992, C4-5, w/ plate fusion for left C5 radiculopathy 12/00   Family History  Problem Relation Age of Onset  . Aortic aneurysm Mother 59  . Hypertension Father   . Heart disease Other     History  Substance Use Topics  . Smoking status: Former Smoker -- 1.00 packs/day for 40 years    Types: Cigarettes  . Smokeless tobacco: Never Used     Comment: Quit three years ago.  . Alcohol Use: Yes     Comment: Drinks 12 beers per year    Review of Systems  All other systems negative except as documented in the HPI. All pertinent positives and negatives as reviewed in the HPI.   Allergies  Review of patient's allergies indicates no known allergies.  Home Medications   Prior to Admission medications   Medication Sig Start Date End Date Taking? Authorizing Provider  amLODipine (NORVASC) 5 MG tablet Take 5 mg by mouth daily.   Yes Historical Provider, MD  calcium carbonate (OS-CAL) 600 MG TABS Take 600 mg by mouth daily.   Yes Historical Provider, MD  DILANTIN 100 MG ER capsule Take 3 capsules (300 mg total) by mouth daily. Do not mail until patient calls 05/07/14  Yes Drema Dallas, DO  fluticasone [facility]) 50 MCG/ACT nasal spray Place 1 spray into both nostrils daily as needed for allergies or rhinitis.   Yes Historical Provider, MD  losartan (COZAAR) 50 MG tablet Take 100 mg by mouth daily.  04/30/14  Yes Historical Provider, MD  Multiple Vitamin (MULTIVITAMIN) capsule Take 1  capsule by mouth daily. am   Yes Historical Provider, MD  oxyCODONE-acetaminophen (PERCOCET/ROXICET) 5-325 MG per tablet Take 2 tablets by mouth every 4 (four) hours as needed for severe pain. 03/11/14  Yes Linton Flemings, MD  azithromycin (ZITHROMAX) 250 MG tablet Take 2 pills by mouth on day 1, then 1 pill by mouth per day on days 2 through 5. Patient not taking: Reported on 12/17/2014 07/27/14   Wendie Agreste, MD   BP 122/78 mmHg  Pulse 73  Temp(Src) 98.5 F (36.9 C)  Resp 17  Ht 5\' 11"  (1.803 m)  Wt 190 lb 3 oz (86.268 kg)  BMI 26.54 kg/m2  SpO2 96% Physical Exam  Constitutional: He is oriented to person, place, and time. He appears well-developed and well-nourished. No distress.  HENT:   Head: Normocephalic and atraumatic.  Mouth/Throat: Oropharynx is clear and moist.  Eyes: EOM are normal. Pupils are equal, round, and reactive to light.  Neck: Normal range of motion. Neck supple.  Cardiovascular: Normal rate, regular rhythm and normal heart sounds.  Exam reveals no gallop and no friction rub.   No murmur heard. Pulmonary/Chest: Effort normal and breath sounds normal. No respiratory distress.  Musculoskeletal: He exhibits no edema.  Neurological: He is alert and oriented to person, place, and time. No cranial nerve deficit. He exhibits normal muscle tone. Coordination normal.  Skin: Skin is warm and dry. No rash noted. No erythema.  Nursing note and vitals reviewed.   ED Course  Procedures (including critical care time) Labs Review Labs Reviewed  PROTIME-INR  APTT  CBC  DIFFERENTIAL  COMPREHENSIVE METABOLIC PANEL  CBG MONITORING, ED  I-STAT TROPOININ, ED    I spoke with the neuro hospitalist who will be down to evaluate the patient to the fact that he has persistent symptoms and a history of TIA.  Patient will need admission to the hospital for further evaluation and care  MDM   Final diagnoses:  None     Dalia Heading, PA-C 12/17/14 1545  Wandra Arthurs, MD 12/17/14 [phone]

## 2014-12-17 NOTE — Progress Notes (Signed)
Pt arrived to unit per stretcher from ED with ED nurse tech. No acute distress noted.  Assessment performed as charted. telemetry monitoring initiated.  Will monitor pt closely   Angeline Slim I 12/17/2014 6:28 PM

## 2014-12-17 NOTE — ED Notes (Signed)
Patient transported to CT 

## 2014-12-17 NOTE — ED Notes (Signed)
BS 80.

## 2014-12-17 NOTE — ED Notes (Signed)
Report attempted. Instructed to call back in 5 minutes

## 2014-12-17 NOTE — ED Notes (Signed)
Pt c/o dizziness upon standing that pt noticed upon waking this am; pt LSN was yesterday; pt has been hypertensive x several days

## 2014-12-17 NOTE — H&P (Signed)
[facility] History and Physical                                                                                    Eric Craig, is a 54 y.o. male  MRN: [id number]   DOB - June 17, 1961  Admit Date - 12/17/2014  Outpatient Primary MD for the patient is ARONSON,RICHARD A, MD  With History of -  Past Medical History  Diagnosis Date  . 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 Seizures     Last seizure was 2001  . Cervical radiculopathy     recurrent  . Headache(784.0)   . Hx of migraine headaches   . Malformation     occipital arteriovenous  . Hemianopsia     residual right homoinmous  . 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 High blood pressure       Past Surgical History  Procedure Laterality Date  . Neck surgery      C4-5 repair in 1989. C6-7 repair in 1993 and 2001. Residual left arm weakness  . Brain surgery      Basilar artery aneurysm repair in 1997, with facial reconstruction in 1998  . Right temporal craniotomy  2/97    basilarr artery aneurysm  . Invasive loop procedure  [place] for treatment of the AVM   . Removal of the arteriovenous malformation  1997    [place]  . C5-c6 laminectomy  1/89  . Cervical discectomy  6/89    C5-C6-7:04/1992, C4-5, w/ plate fusion for left C5 radiculopathy 12/00    in for   Chief Complaint  Patient presents with  . Dizziness     HPI Eric Craig  is a 54 y.o. male, history of stroke and prior cerebral aneurysm repair as well as seizure disorder and hypertension. Also has a history of chronic stable right homonymous hemianopsia. Patient reports that he has been dizzy for several months and symptoms were worse today. He reports he has had some chronic dizziness with standing especially gets up too fast but typically this resolves rather quickly. Today the symptoms did not resolve. He has recently had difficulty in maintaining adequate blood pressure  control. Several months ago his chronic care physician started him on losartan and last week due to persistent hypertension he was started on Norvasc. He typically takes 5 mg per day but today when his blood pressure was at very high he took an extra Norvasc. He has done that previously and has not had any dizziness symptoms like he has had today. He reports a subjective sensation of feeling unsteady on his feet but his gait is not disturbed when he walks. He denies any vertigo type symptoms or any associated nausea with this dizziness.  In the ER the patient was afebrile. The blood pressure 122/78. Patient showed me a paper from where he took his blood pressure at home and at that time his blood pressure was in the 130/100 range. Orthostatic vital signs were checked here and were not positive. CT of the head was obtained and showed  no acute changes. Patient noted was stable encephalomalacia of the left posterior parietal and left occipital lobe. Neurology has evaluated the patient and has ordered an MRI and is recommended the patient be admitted for TIA evaluation.  Review of Systems   In addition to the HPI above,  No Fever-chills, myalgias or other constitutional symptoms No Headache, changes with Vision or hearing, new weakness, tingling, numbness in any extremity; only new symptom is dizziness No problems swallowing food or Liquids, indigestion/reflux No Chest pain, Cough or Shortness of Breath, palpitations, orthopnea or DOE No Abdominal pain, N/V; no melena or hematochezia, no dark tarry stools, Bowel movements are regular, No dysuria, hematuria or flank pain No new skin rashes, lesions, masses or bruises, No new joints pains-aches No recent weight gain or loss No polyuria, polydypsia or polyphagia,  *A full 10 point Review of Systems was done, except as stated above, all other Review of Systems were negative.  Social History History  Substance Use Topics  . Smoking status: Former Smoker  -- 1.00 packs/day for 40 years    Types: Cigarettes  . Smokeless tobacco: Never Used     Comment: Quit three years ago.  . Alcohol Use: Yes     Comment: Drinks 12 beers per year  Works as an Clinical biochemist     Family History Family History  Problem Relation Age of Onset  . Aortic aneurysm Mother 16  . Hypertension Father   . Heart disease Other     Prior to Admission medications   Medication Sig Start Date End Date Taking? Authorizing Provider  amLODipine (NORVASC) 5 MG tablet Take 5 mg by mouth daily.   Yes Historical Provider, MD  calcium carbonate (OS-CAL) 600 MG TABS Take 600 mg by mouth daily.   Yes Historical Provider, MD  DILANTIN 100 MG ER capsule Take 3 capsules (300 mg total) by mouth daily. Do not mail until patient calls 05/07/14  Yes Drema Dallas, DO  fluticasone [facility]) 50 MCG/ACT nasal spray Place 1 spray into both nostrils daily as needed for allergies or rhinitis.   Yes Historical Provider, MD  losartan (COZAAR) 50 MG tablet Take 100 mg by mouth daily.  04/30/14  Yes Historical Provider, MD  Multiple Vitamin (MULTIVITAMIN) capsule Take 1 capsule by mouth daily. am   Yes Historical Provider, MD  oxyCODONE-acetaminophen (PERCOCET/ROXICET) 5-325 MG per tablet Take 2 tablets by mouth every 4 (four) hours as needed for severe pain. 03/11/14  Yes Linton Flemings, MD  azithromycin (ZITHROMAX) 250 MG tablet Take 2 pills by mouth on day 1, then 1 pill by mouth per day on days 2 through 5. Patient not taking: Reported on 12/17/2014 07/27/14   Wendie Agreste, MD    No Known Allergies  Physical Exam  Vitals  Blood pressure 139/83, pulse 68, temperature 98.5 F (36.9 C), resp. rate 15, height 5\' 11"  (1.803 m), weight 190 lb 3 oz (86.268 kg), SpO2 97 %.   General:  In no acute distress, appears healthy and well nourished  Psych:  Normal affect, Denies Suicidal or Homicidal ideations, Awake Alert, Oriented X 3. Speech and thought patterns are clear and appropriate, no apparent  short term memory deficits  Neuro:   No focal neurological deficits, CN II through XII intact, Strength 5/5 all 4 extremities, Sensation intact all 4 extremities. Did not ask patient to stand and attempt to reproduce symptoms since neurology has previously evaluated him, no nystagmus was noted.  ENT:  Ears and Eyes appear Normal,  Conjunctivae clear, PER. Moist oral mucosa without erythema or exudates.  Neck:  Supple, No lymphadenopathy appreciated  Respiratory:  Symmetrical chest wall movement, Good air movement bilaterally, CTAB. Room Air  Cardiac:  RRR, No Murmurs, no LE edema noted, no JVD, No carotid bruits, peripheral pulses palpable at 2+  Abdomen:  Positive bowel sounds, Soft, Non tender, Non distended,  No masses appreciated, no obvious hepatosplenomegaly  Skin:  No Cyanosis, Normal Skin Turgor, No Skin Rash or Bruise.  Extremities: Symmetrical without obvious trauma or injury,  no effusions.  Data Review  CBC  Recent Labs Lab 12/17/14 1329  WBC 4.0  HGB 15.1  HCT 43.0  PLT 184  MCV 95.8  MCH 33.6  MCHC 35.1  RDW 13.0  LYMPHSABS 1.2  MONOABS 0.3  EOSABS 0.0  BASOSABS 0.0    Chemistries   Recent Labs Lab 12/17/14 1329  NA 139  K 3.8  CL 103  CO2 27  GLUCOSE 96  BUN 12  CREATININE 0.93  CALCIUM 9.2  AST 20  ALT 25  ALKPHOS 114  BILITOT 0.8    estimated creatinine clearance is 94.5 mL/min (by C-G formula based on Cr of 0.93).  No results for input(s): TSH, T4TOTAL, T3FREE, THYROIDAB in the last 72 hours.  Invalid input(s): FREET3  Coagulation profile  Recent Labs Lab 12/17/14 1329  INR 0.95    No results for input(s): DDIMER in the last 72 hours.  Cardiac Enzymes No results for input(s): CKMB, TROPONINI, MYOGLOBIN in the last 168 hours.  Invalid input(s): CK  Invalid input(s): POCBNP  Urinalysis    Component Value Date/Time   COLORURINE YELLOW 03/11/2014 [place] 03/11/2014 0239   LABSPEC 1.037* 03/11/2014  0239   PHURINE 5.0 03/11/2014 0239   GLUCOSEU NEGATIVE 03/11/2014 0239   HGBUR NEGATIVE 03/11/2014 0239   BILIRUBINUR NEGATIVE 03/11/2014 0239   KETONESUR NEGATIVE 03/11/2014 0239   PROTEINUR NEGATIVE 03/11/2014 0239   UROBILINOGEN 0.2 03/11/2014 0239   NITRITE NEGATIVE 03/11/2014 0239   LEUKOCYTESUR NEGATIVE 03/11/2014 0239    Imaging results:   Ct Head (brain) Wo Contrast  12/17/2014   CLINICAL DATA:  Dizziness  EXAM: CT HEAD WITHOUT CONTRAST  TECHNIQUE: Contiguous axial images were obtained from the base of the skull through the vertex without intravenous contrast.  COMPARISON:  11/13/2011  FINDINGS: No skull fracture is noted. Again noted status post right frontotemporal craniotomy. Again noted multiple surgical clips post AVM clipping. Encephalomalacia in left parietal and left occipital lobe is stable. Old lacunar infarct in left thalamus is stable. No acute cortical infarction. No mass lesion is noted on this unenhanced scan. Again noted surgical clips in right suprasellar region. Stable postsurgical changes left posterior parietal skull  IMPRESSION: No acute intracranial abnormality. Stable postsurgical changes and prior craniotomy as described above. Stable encephalomalacia in left posterior parietal and left occipital lobe.   Electronically Signed   By: Lahoma Crocker M.D.   On: 12/17/2014 15:12     Assessment & Plan  Principal Problem:   Dizziness/history of CVA as well as prior cerebral aneurysm repair -Admit to telemetry/observational status -Appreciate neurology assistance -Follow-up on MRI -Check 2-D echocardiogram and carotid duplex since last was checked 2013 -No focal neurological deficits and no indication for PT/OT or swallowing evaluation at this time; okay to allow diet -Current symptoms appear consistent with vertebrobasilar disorder  Active Problems:   Seizure disorder -Symptoms not consistent with seizure i.e. no staring or post ictal phase -Continue Dilantin -No  evidence of orthostasis so doubt having side effects from Dilantin    HTN  -Blood pressure per patient documentation was poorly controlled at home but after arrival to ER was controlled -For now will continue current losartan and Norvasc    Right homonymous hemianopsia/chronic -Is chronic and unchanged    DVT Prophylaxis: Subcutaneous heparin  Family Communication:   Wife and other family members at bedside with patient's permission  Code Status:  Full code  Condition:  Stable  Time spent in minutes : 60   Amani Nodarse L. ANP on 12/17/2014 at 4:25 PM  Between 7am to 7pm - Pager - [phone]  After 7pm go to [email or web address] - password [email or web address]  And look for the night coverage person covering me after hours  [facility]

## 2014-12-17 NOTE — Progress Notes (Signed)
Pt refused VTE prophylaxis SQ Heparin. Pt was educated and understands risk for not taking med. Will continue to monitor.

## 2014-12-18 ENCOUNTER — Observation Stay (HOSPITAL_COMMUNITY): Payer: 59

## 2014-12-18 DIAGNOSIS — G459 Transient cerebral ischemic attack, unspecified: Secondary | ICD-10-CM

## 2014-12-18 DIAGNOSIS — R42 Dizziness and giddiness: Secondary | ICD-10-CM

## 2014-12-18 DIAGNOSIS — R269 Unspecified abnormalities of gait and mobility: Secondary | ICD-10-CM

## 2014-12-18 LAB — LIPID PANEL
CHOLESTEROL: 184 mg/dL (ref 0–200)
HDL: 56 mg/dL (ref >39)
LDL CALC: 108 mg/dL — AB (ref 0–99)
TRIGLYCERIDES: 98 mg/dL (ref <150)
Total CHOL/HDL Ratio: 3.3 RATIO
VLDL: 20 mg/dL (ref 0–40)

## 2014-12-18 LAB — RAPID URINE DRUG SCREEN, HOSP PERFORMED
AMPHETAMINES: NOT DETECTED
BARBITURATES: NOT DETECTED
BENZODIAZEPINES: NOT DETECTED
COCAINE: NOT DETECTED
Opiates: NOT DETECTED
TETRAHYDROCANNABINOL: NOT DETECTED

## 2014-12-18 MED ORDER — ASPIRIN EC 81 MG PO TBEC
81.0000 mg | DELAYED_RELEASE_TABLET | Freq: Every day | ORAL | Status: DC
  Administered 2014-12-18 – 2014-12-19 (×2): 81 mg via ORAL
  Filled 2014-12-18 (×2): qty 1

## 2014-12-18 NOTE — Evaluation (Signed)
Occupational Therapy Evaluation Patient Details Name: Eric Craig MRN: [id number] DOB: 1961/06/22 Today's Date: 12/18/2014    History of Present Illness Patient is a 54 y/o male with PMH of stroke, cerebral aneurysm repair, seizure disorder, HTN and chronic stable right homonymous hemianopsia. Patient reports that he has been dizzy for several months and symptoms were worse today. CT -unremarkable. MRI head-stable encephalomalacia of the left posterior parietal and left occipital lobe.    Clinical Impression   Pt independent with all selfcare and functional transfers.  Pt with history of bilateral visual field deficits which are baseline.  No further OT needs.      Follow Up Recommendations  No OT follow up          Precautions / Restrictions Precautions Precautions: None Restrictions Weight Bearing Restrictions: No      Mobility Bed Mobility Overal bed mobility: Independent                Transfers Overall transfer level: Independent                    Balance Overall balance assessment: Independent Sitting-balance support: Feet supported;No upper extremity supported Sitting balance-Leahy Scale: Normal     Standing balance support: During functional activity Standing balance-Leahy Scale: Normal               High level balance activites: Direction changes;Turns;Head turns;Sudden stops High Level Balance Comments: Tolerated above and stepping over objects, changes in gait speed without difficulty.             ADL Overall ADL's : Independent                                             Vision Vision Assessment?: No apparent visual deficits   Perception Perception Perception Tested?: No   Praxis Praxis Praxis tested?: Not tested    Pertinent Vitals/Pain Pain Assessment: No/denies pain     Hand Dominance Right   Extremity/Trunk Assessment Upper Extremity Assessment Upper Extremity Assessment: Overall WFL for  tasks assessed   Lower Extremity Assessment Lower Extremity Assessment: Defer to PT evaluation   Cervical / Trunk Assessment Cervical / Trunk Assessment: Normal   Communication Communication Communication: No difficulties   Cognition Arousal/Alertness: Awake/alert Behavior During Therapy: WFL for tasks assessed/performed Overall Cognitive Status: Within Functional Limits for tasks assessed                                Home Living Family/patient expects to be discharged to:: Private residence Living Arrangements: Spouse/significant other Available Help at Discharge: Family;Available PRN/intermittently Type of Home: House Home Access: Stairs to enter [employer] of Steps: 3 Entrance Stairs-Rails: None Home Layout: Two level;Able to live on main level with bedroom/bathroom Alternate Level Stairs-Number of Steps: 13 Alternate Level Stairs-Rails: Left Bathroom Shower/Tub: Tub/shower unit Shower/tub characteristics: Door Biochemist, clinical: Standard     Home Equipment: None          Prior Functioning/Environment Level of Independence: Independent        Comments: Works as an Sales promotion account executive of [employer]    Activity Tolerance: Patient tolerated treatment well Patient left:  in bed   Time: 1535-1601 OT Time Calculation (min): 26 min Charges:  OT General Charges $OT Visit: 1 Procedure G-Codes: OT G-codes **NOT FOR INPATIENT CLASS** Functional Assessment Tool Used: clinical judgement Functional Limitation: Self care Self Care Current Status (M1470): 0 percent impaired, limited or restricted Self Care Goal Status (L2957): 0 percent impaired, limited or restricted Self Care Discharge Status (M7340): 0 percent impaired, limited or restricted  Ambrea Hegler 12/18/2014, 4:36 PM

## 2014-12-18 NOTE — Progress Notes (Addendum)
Subjective: Patients symptoms fully resolved. Feeling well.   Objective: Current vital signs: BP 142/94 mmHg  Pulse 70  Temp(Src) 98.1 F (36.7 C) (Oral)  Resp 16  Ht 5\' 11"  (1.803 m)  Wt 88.043 kg (194 lb 1.6 oz)  BMI 27.08 kg/m2  SpO2 96% Vital signs in last 24 hours: Temp:  [97.7 F (36.5 C)-98.5 F (36.9 C)] 98.1 F (36.7 C) (03/08 0600) Pulse Rate:  [65-82] 70 (03/08 0600) Resp:  [11-18] 16 (03/08 0600) BP: (114-154)/(78-97) 142/94 mmHg (03/08 0600) SpO2:  [93 %-99 %] 96 % (03/08 0600) Weight:  [86.268 kg (190 lb 3 oz)-88.043 kg (194 lb 1.6 oz)] 88.043 kg (194 lb 1.6 oz) (03/07 1835)  Intake/Output from previous day: 03/07 0701 - 03/08 0700 In: -  Out: 90 [Urine:90] Intake/Output this shift: Total I/O In: 360 [P.O.:360] Out: -  Nutritional status: Diet Heart  Neurologic Exam: General: NAD Mental Status: Alert, oriented, thought content appropriate.  Speech fluent without evidence of aphasia.  Able to follow 3 step commands without difficulty. Cranial Nerves: II: Discs flat bilaterally; Visual fields grossly normal, pupils equal, round, reactive to light and accommodation III,IV, VI: ptosis not present, extra-ocular motions intact bilaterally V,VII: smile symmetric, facial light touch sensation normal bilaterally VIII: hearing normal bilaterally IX,X: uvula rises symmetrically XI: bilateral shoulder shrug XII: midline tongue extension without atrophy or fasciculations  Motor: Right : Upper extremity   5/5    Left:     Upper extremity   5/5  Lower extremity   5/5     Lower extremity   5/5 Tone and bulk:normal tone throughout; no atrophy noted Sensory: Pinprick and light touch intact throughout, bilaterally Deep Tendon Reflexes:  Right: Upper Extremity   Left: Upper extremity   biceps (C-5 to C-6) 2/4   biceps (C-5 to C-6) 2/4 tricep (C7) 2/4    triceps (C7) 2/4 Brachioradialis (C6) 2/4  Brachioradialis (C6) 2/4  Lower Extremity Lower Extremity   quadriceps (L-2 to L-4) 2/4   quadriceps (L-2 to L-4) 2/4 Achilles (S1) 2/4   Achilles (S1) 2/4  Plantars: Right: downgoing   Left: downgoing Cerebellar: normal finger-to-nose,  normal heel-to-shin test Gait: normal gait    Lab Results: Basic Metabolic Panel:  Recent Labs Lab 12/17/14 1329  NA 139  K 3.8  CL 103  CO2 27  GLUCOSE 96  BUN 12  CREATININE 0.93  CALCIUM 9.2    Liver Function Tests:  Recent Labs Lab 12/17/14 1329  AST 20  ALT 25  ALKPHOS 114  BILITOT 0.8  PROT 7.1  ALBUMIN 4.4   No results for input(s): LIPASE, AMYLASE in the last 168 hours. No results for input(s): AMMONIA in the last 168 hours.  CBC:  Recent Labs Lab 12/17/14 1329  WBC 4.0  NEUTROABS 2.5  HGB 15.1  HCT 43.0  MCV 95.8  PLT 184    Cardiac Enzymes: No results for input(s): CKTOTAL, CKMB, CKMBINDEX, TROPONINI in the last 168 hours.  Lipid Panel: No results for input(s): CHOL, TRIG, HDL, CHOLHDL, VLDL, LDLCALC in the last 168 hours.  CBG:  Recent Labs Lab 12/17/14 1406  GLUCAP 80    Microbiology: No results found for this or any previous visit.  Coagulation Studies:  Recent Labs  12/17/14 1329  LABPROT 12.8  INR 0.95    Imaging: Ct Head (brain) Wo Contrast  12/17/2014   CLINICAL DATA:  Dizziness  EXAM: CT HEAD WITHOUT CONTRAST  TECHNIQUE: Contiguous axial images were obtained from the base  of the skull through the vertex without intravenous contrast.  COMPARISON:  11/13/2011  FINDINGS: No skull fracture is noted. Again noted status post right frontotemporal craniotomy. Again noted multiple surgical clips post AVM clipping. Encephalomalacia in left parietal and left occipital lobe is stable. Old lacunar infarct in left thalamus is stable. No acute cortical infarction. No mass lesion is noted on this unenhanced scan. Again noted surgical clips in right suprasellar region. Stable postsurgical changes left posterior parietal skull  IMPRESSION: No acute  intracranial abnormality. Stable postsurgical changes and prior craniotomy as described above. Stable encephalomalacia in left posterior parietal and left occipital lobe.   Electronically Signed   By: Lahoma Crocker M.D.   On: 12/17/2014 15:12   Mr Brain Wo Contrast (neuro Protocol)  12/17/2014   CLINICAL DATA:  Acute onset dizziness today. History of aneurysm clipping, hypertension, stroke, migraine headache, seizure. Known of LEFT vertebral artery occlusion, status post treated LEFT parietal occipital AVM.  EXAM: MRI HEAD WITHOUT CONTRAST  TECHNIQUE: Multiplanar, multiecho pulse sequences of the brain and surrounding structures were obtained without intravenous contrast.  COMPARISON:  CT of the head December 17, 2014 at 1439 hours and MRI of the head July 12, 2012  FINDINGS: No reduced diffusion to suggest acute ischemia; quite ill material and aneurysm clip resultant susceptibility artifact somewhat decreasing sensitivity. Multifocal susceptibility artifact corresponding to surgical hardware, including calvarial suture material. No definite intraparenchymal  Stable RIGHT mesial temporal lobe and RIGHT frontal encephalomalacia. Stable LEFT parietal occipital large area of encephalomalacia with susceptibility artifact consistent with embolic material. Ex vacuo dilatation of LEFT occipital horn, atrium. No hydrocephalus. No midline shift. Mild white matter changes exclusive of the aforementioned abnormality likely reflect chronic small vessel ischemic disease. Remote LEFT thalamus lacunar infarct. No hydrocephalus. Mild global parenchymal brain volume loss for age.  No abnormal extra-axial fluid collections. Nonvisualized LEFT vertebral artery flow void consistent with prior imaging. Ocular globes and orbital contents are unremarkable. Mild frontoethmoidal mucosal thickening without paranasal sinus air-fluid levels. The mastoid air cells are well aerated. No abnormal sellar expansion. No cerebellar tonsillar ectopia.   IMPRESSION: No acute intracranial process.  Susceptibility artifact from aneurysm clipping and LEFT parietal occipital embolic material within AVM limits evaluation though, is overall stable from prior MRI.  Stable treated LEFT parietal occipital AVM. Additional multifocal encephalomalacia may be posttraumatic or ischemic, similar. Mild white matter changes suggest chronic small vessel ischemic disease.   Electronically Signed   By: Elon Alas   On: 12/17/2014 22:41    Medications:  Scheduled: . amLODipine  5 mg Oral Daily  . aspirin EC  81 mg Oral Daily  . calcium carbonate  1 tablet Oral Q breakfast  . losartan  100 mg Oral Daily  . multivitamin with minerals  1 tablet Oral Daily  . phenytoin  300 mg Oral Daily    Assessment/Plan: 54 y.o. male presenting to hospital after waking up and noting he was drifting to the left. Currently symptoms have fully resolved and patient is back to baseline.  BP well controlled, Dilantin level therapeutic and on ASA 81 mg daily. 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   A1c pending.   Recommend: 1) Continue ASA 81 mg daily  Neurology will S/O   Etta Quill PA-C [facility] [phone]  12/18/2014, 9:23 AM

## 2014-12-18 NOTE — Progress Notes (Signed)
UR completed 

## 2014-12-18 NOTE — Progress Notes (Signed)
[facility] PROGRESS NOTE  QUEST TAVENNER FHL:[id number] DOB: 1961/02/25 DOA: 12/17/2014 PCP: Geoffery Lyons, MD  Assessment/Plan: Principal Problem:   Dizziness Active Problems:   History of stroke   Seizure disorder   HTN (hypertension)   History of cerebral aneurysm repair   Right homonymous hemianopsia/chronic    Dizziness/history of CVA as well as prior cerebral aneurysm repair Continue telemetry BP well controlled, Dilantin level therapeutic and on ASA 81 mg daily. Awaiting A1c,LDL, Carotid doppler, Echo and MRA.  -Check 2-D echocardiogram and carotid duplex since last was checked 2013 : Bilateral: 1-39% ICA stenosis. Right Vertebral artery flow is antegrade. Left vertebral not visualized. PT/OT eval   Seizure disorder -Symptoms not consistent with seizure i.e. no staring or post ictal phase -Continue Dilantin -No evidence of orthostasis so doubt having side effects from Dilantin   HTN  -Blood pressure per patient documentation was poorly controlled at home but after arrival to ER was controlled Continue losartan and Norvasc   Right homonymous hemianopsia/chronic -Is chronic and unchanged    DVT Prophylaxis: Subcutaneous heparin  Code Status: full Family Communication: family updated about patient's clinical progress Disposition Plan:  As above    Brief narrative: Eric Craig is a 54 y.o. male, history of stroke and prior cerebral aneurysm repair as well as seizure disorder and hypertension. Also has a history of chronic stable right homonymous hemianopsia. Patient reports that he has been dizzy for several months and symptoms were worse today. He reports he has had some chronic dizziness with standing especially gets up too fast but typically this resolves rather quickly. Today the symptoms did not resolve. He has recently had difficulty in maintaining adequate blood pressure control. Several months ago his chronic care physician started him on  losartan and last week due to persistent hypertension he was started on Norvasc. He typically takes 5 mg per day but today when his blood pressure was at very high he took an extra Norvasc. He has done that previously and has not had any dizziness symptoms like he has had today. He reports a subjective sensation of feeling unsteady on his feet but his gait is not disturbed when he walks. He denies any vertigo type symptoms or any associated nausea with this dizziness.  In the ER the patient was afebrile. The blood pressure 122/78. Patient showed me a paper from where he took his blood pressure at home and at that time his blood pressure was in the 130/100 range. Orthostatic vital signs were checked here and were not positive. CT of the head was obtained and showed no acute changes. Patient noted was stable encephalomalacia of the left posterior parietal and left occipital lobe. Neurology has evaluated the patient and has ordered an MRI and is recommended the patient be admitted for TIA evaluation.  Consultants:  Neurology  Procedures:  None  Antibiotics: None  HPI/Subjective: No dizziness this morning, feeling well  Objective: Filed Vitals:   12/18/14 0000 12/18/14 0200 12/18/14 0400 12/18/14 0600  BP: 144/82 143/91 154/88 142/94  Pulse: 72 65 74 70  Temp: 98.1 F (36.7 C) 97.9 F (36.6 C) 98.2 F (36.8 C) 98.1 F (36.7 C)  TempSrc: Oral Oral Oral Oral  Resp: 16 14 16 16   Height:      Weight:      SpO2: 93% 94% 94% 96%    Intake/Output Summary (Last 24 hours) at 12/18/14 1235 Last data filed at 12/18/14 0843  Gross per 24 hour  Intake  360 ml  Output     90 ml  Net    270 ml    Exam:  General: No acute respiratory distress Lungs: Clear to auscultation bilaterally without wheezes or crackles Cardiovascular: Regular rate and rhythm without murmur gallop or rub normal S1 and S2 Abdomen: Nontender, nondistended, soft, bowel sounds positive, no rebound, no ascites, no  appreciable mass Extremities: No significant cyanosis, clubbing, or edema bilateral lower extremities      Data Reviewed: Basic Metabolic Panel:  Recent Labs Lab 12/17/14 1329  NA 139  K 3.8  CL 103  CO2 27  GLUCOSE 96  BUN 12  CREATININE 0.93  CALCIUM 9.2    Liver Function Tests:  Recent Labs Lab 12/17/14 1329  AST 20  ALT 25  ALKPHOS 114  BILITOT 0.8  PROT 7.1  ALBUMIN 4.4   No results for input(s): LIPASE, AMYLASE in the last 168 hours. No results for input(s): AMMONIA in the last 168 hours.  CBC:  Recent Labs Lab 12/17/14 1329  WBC 4.0  NEUTROABS 2.5  HGB 15.1  HCT 43.0  MCV 95.8  PLT 184    Cardiac Enzymes: No results for input(s): CKTOTAL, CKMB, CKMBINDEX, TROPONINI in the last 168 hours. BNP (last 3 results) No results for input(s): BNP in the last 8760 hours.  ProBNP (last 3 results) No results for input(s): PROBNP in the last 8760 hours.    CBG:  Recent Labs Lab 12/17/14 1406  GLUCAP 80    No results found for this or any previous visit (from the past 240 hour(s)).   Studies: Ct Head (brain) Wo Contrast  12/17/2014   CLINICAL DATA:  Dizziness  EXAM: CT HEAD WITHOUT CONTRAST  TECHNIQUE: Contiguous axial images were obtained from the base of the skull through the vertex without intravenous contrast.  COMPARISON:  11/13/2011  FINDINGS: No skull fracture is noted. Again noted status post right frontotemporal craniotomy. Again noted multiple surgical clips post AVM clipping. Encephalomalacia in left parietal and left occipital lobe is stable. Old lacunar infarct in left thalamus is stable. No acute cortical infarction. No mass lesion is noted on this unenhanced scan. Again noted surgical clips in right suprasellar region. Stable postsurgical changes left posterior parietal skull  IMPRESSION: No acute intracranial abnormality. Stable postsurgical changes and prior craniotomy as described above. Stable encephalomalacia in left posterior  parietal and left occipital lobe.   Electronically Signed   By: Lahoma Crocker M.D.   On: 12/17/2014 15:12   Mr Jodene Nam Head Wo Contrast  12/18/2014   CLINICAL DATA:  Drifting to the left when walking yesterday. Symptoms have since resolved. Possible stroke. 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  EXAM: MRA HEAD WITHOUT CONTRAST  TECHNIQUE: Angiographic images of the Circle of Willis were obtained using MRA technique without intravenous contrast.  COMPARISON:  Head MRI 12/17/2014 and MRA 11/14/2011.  FINDINGS: The visualized distal right vertebral artery is patent and supplies the basilar. Right PICA appears patent although its origin was not imaged. No flow is identified in the distal left vertebral artery consistent with previously described occlusion. AICA origins are patent. Basilar artery appears patent, although the distal basilar artery is obscured by artifact from prior aneurysm clipping, unchanged. Right SCA appears patent, although it is obscured proximally. Proximal PCAs, including their origins, are obscured by artifact. Flow seen in the P2 segments.  Internal carotid arteries are patent from skullbase to carotid termini. No ICA stenosis is identified,  however the right supraclinoid ICA is partially obscured by artifact from the basilar artery aneurysm clip. There is a small, patent anterior communicating artery. ACAs and MCAs are unremarkable aside from mild branch vessel irregularity. No new or recurrent aneurysm is identified within the limitations imposed by artifact.  IMPRESSION: 1. 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 2. Prior distal basilar artery aneurysm clipping with limited evaluation of this region due to artifact as above, unchanged. 3. No evidence of major arterial occlusion or significant proximal stenosis involving the anterior circulation.   Electronically Signed   By: Logan Bores   On: 12/18/2014 11:45   Mr  Brain Wo Contrast (neuro Protocol)  12/17/2014   CLINICAL DATA:  Acute onset dizziness today. History of aneurysm clipping, hypertension, stroke, migraine headache, seizure. Known of LEFT vertebral artery occlusion, status post treated LEFT parietal occipital AVM.  EXAM: MRI HEAD WITHOUT CONTRAST  TECHNIQUE: Multiplanar, multiecho pulse sequences of the brain and surrounding structures were obtained without intravenous contrast.  COMPARISON:  CT of the head December 17, 2014 at 1439 hours and MRI of the head July 12, 2012  FINDINGS: No reduced diffusion to suggest acute ischemia; quite ill material and aneurysm clip resultant susceptibility artifact somewhat decreasing sensitivity. Multifocal susceptibility artifact corresponding to surgical hardware, including calvarial suture material. No definite intraparenchymal  Stable RIGHT mesial temporal lobe and RIGHT frontal encephalomalacia. Stable LEFT parietal occipital large area of encephalomalacia with susceptibility artifact consistent with embolic material. Ex vacuo dilatation of LEFT occipital horn, atrium. No hydrocephalus. No midline shift. Mild white matter changes exclusive of the aforementioned abnormality likely reflect chronic small vessel ischemic disease. Remote LEFT thalamus lacunar infarct. No hydrocephalus. Mild global parenchymal brain volume loss for age.  No abnormal extra-axial fluid collections. Nonvisualized LEFT vertebral artery flow void consistent with prior imaging. Ocular globes and orbital contents are unremarkable. Mild frontoethmoidal mucosal thickening without paranasal sinus air-fluid levels. The mastoid air cells are well aerated. No abnormal sellar expansion. No cerebellar tonsillar ectopia.  IMPRESSION: No acute intracranial process.  Susceptibility artifact from aneurysm clipping and LEFT parietal occipital embolic material within AVM limits evaluation though, is overall stable from prior MRI.  Stable treated LEFT parietal occipital  AVM. Additional multifocal encephalomalacia may be posttraumatic or ischemic, similar. Mild white matter changes suggest chronic small vessel ischemic disease.   Electronically Signed   By: Elon Alas   On: 12/17/2014 22:41    Scheduled Meds: . amLODipine  5 mg Oral Daily  . aspirin EC  81 mg Oral Daily  . calcium carbonate  1 tablet Oral Q breakfast  . losartan  100 mg Oral Daily  . multivitamin with minerals  1 tablet Oral Daily  . phenytoin  300 mg Oral Daily   Continuous Infusions:   Principal Problem:   Dizziness Active Problems:   History of stroke   Seizure disorder   HTN (hypertension)   History of cerebral aneurysm repair   Right homonymous hemianopsia/chronic    Time spent: 40 minutes   [facility] Pager [phone]. If 7PM-7AM, please contact night-coverage at [email or web address], password [facility] 12/18/2014, 12:35 PM

## 2014-12-18 NOTE — Progress Notes (Signed)
*  PRELIMINARY RESULTS* Vascular Ultrasound Carotid Duplex (Doppler) has been completed.  Preliminary findings: Bilateral:  1-39% ICA stenosis.  Right Vertebral artery flow is antegrade.  Left vertebral not visualized.    Landry Mellow, RDMS, RVT  12/18/2014, 11:21 AM

## 2014-12-18 NOTE — Evaluation (Signed)
Physical Therapy Evaluation Patient Details Name: Eric Craig MRN: [id number] DOB: 1961-06-24 Today's Date: 12/18/2014   History of Present Illness  Patient is a 54 y/o male with PMH of stroke, cerebral aneurysm repair, seizure disorder, HTN and chronic stable right homonymous hemianopsia. Patient reports that he has been dizzy for several months and symptoms were worse today. CT -unremarkable. MRI head-stable encephalomalacia of the left posterior parietal and left occipital lobe.     Clinical Impression  Patient presents close to functional baseline and able to ambulate community distances while performing higher level balance challenges without difficulty. Tolerated stair negotiation Mod I. Education provided on warning signs/symptoms of CVA/TIA. Encourage ambulation daily. Pt does not require skilled therapy services. No deficits noted or reported. Discharge from therapy.    Follow Up Recommendations No PT follow up    Equipment Recommendations  None recommended by PT    Recommendations for Other Services       Precautions / Restrictions Precautions Precautions: None Restrictions Weight Bearing Restrictions: No      Mobility  Bed Mobility Overal bed mobility: Independent                Transfers Overall transfer level: Independent                  Ambulation/Gait Ambulation/Gait assistance: Independent Ambulation Distance (Feet): 510 Feet Assistive device: None Gait Pattern/deviations: WFL(Within Functional Limits)   Gait velocity interpretation: at or above normal speed for age/gender General Gait Details: Steady gait. Tolerated higher level balance activities w/out difficulty.   Stairs Stairs: Yes Stairs assistance: Modified independent (Device/Increase time) Stair Management: One rail Right;Alternating pattern Number of Stairs: 12    Wheelchair Mobility    Modified Rankin (Stroke Patients Only)       Balance Overall balance  assessment: Needs assistance Sitting-balance support: Feet supported;No upper extremity supported Sitting balance-Leahy Scale: Normal     Standing balance support: During functional activity Standing balance-Leahy Scale: Good               High level balance activites: Direction changes;Turns;Head turns;Sudden stops High Level Balance Comments: Tolerated above and stepping over objects, changes in gait speed without difficulty.              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        Extremity/Trunk Assessment   Upper Extremity Assessment: Overall WFL for tasks assessed           Lower Extremity Assessment: Overall WFL for tasks assessed         Communication   Communication: No difficulties  Cognition Arousal/Alertness: Awake/alert Behavior During Therapy: WFL for tasks assessed/performed Overall Cognitive Status: Within Functional Limits for tasks assessed                      General Comments      Exercises        Assessment/Plan    PT Assessment Patent does not need any further PT services  PT Diagnosis     PT Problem List    PT Treatment Interventions     PT Goals (Current goals can be found in the Care Plan section) Acute Rehab PT Goals PT Goal Formulation: All assessment and education complete, DC therapy    Frequency     Barriers to discharge        Co-evaluation               End  of Session Equipment Utilized During Treatment: Gait belt Activity Tolerance: Patient tolerated treatment well Patient left: in bed;with call bell/phone within reach Nurse Communication: Mobility status         Time: 8182-9937 PT Time Calculation (min) (ACUTE ONLY): 13 min   Charges:   PT Evaluation $Initial PT Evaluation Tier I: 1 Procedure     PT G CodesCandy Sledge A 01-14-15, 3:10 PM  Candy Sledge, PT, DPT [phone]

## 2014-12-18 NOTE — Progress Notes (Signed)
Echocardiogram 2D Echocardiogram has been performed.  Eric Craig 12/18/2014, 11:51 AM

## 2014-12-18 NOTE — Progress Notes (Signed)
Addendum to PT eval - added G-codes    12-25-2014 1510  PT G-Codes **NOT FOR INPATIENT CLASS**  Functional Assessment Tool Used Clinical judgment  Functional Limitation Mobility: Walking and moving around  Mobility: Walking and Moving Around Current Status ([id number]) CI  Mobility: Walking and Moving Around Goal Status [phone]) CH  Mobility: Walking and Moving Around Discharge Status [phone]) [place], [place], DPT [phone]

## 2014-12-19 DIAGNOSIS — R269 Unspecified abnormalities of gait and mobility: Secondary | ICD-10-CM

## 2014-12-19 LAB — HEMOGLOBIN A1C
Hgb A1c MFr Bld: 5.4 % (ref 4.8–5.6)
MEAN PLASMA GLUCOSE: 108 mg/dL

## 2014-12-19 MED ORDER — PRAVASTATIN SODIUM 40 MG PO TABS: 40.0000 mg | ORAL_TABLET | Freq: Every day | ORAL | Status: DC

## 2014-12-19 MED ORDER — ASPIRIN 81 MG PO TBEC: 81.0000 mg | DELAYED_RELEASE_TABLET | Freq: Every day | ORAL | Status: DC

## 2014-12-19 NOTE — Progress Notes (Signed)
Discharge orders received.  Discharge instructions and follow-up appointments reviewed with the patient.  VSS upon discharge.  IV removed and education complete.  Walked out to main entrance, family present. Cori Razor, RN

## 2014-12-19 NOTE — Discharge Summary (Signed)
Physician Discharge Summary  Eric Craig AXK:[id number] DOB: 1961-03-09 DOA: 12/17/2014  PCP: Geoffery Lyons, MD  Admit date: 12/17/2014 Discharge date: 12/19/2014  Time spent: 30 minutes  Recommendations for Outpatient Follow-up:  Reassess BP and adjust medications as needed Check BMET to follow renal function and electrolytes Repeat lipid anel LFT's in 6-8 weeks to follow Cholesterol level and liver function with initiation of statins  Discharge Diagnoses:  Principal Problem:   Dizziness Active Problems:   History of stroke   Seizure disorder   HTN (hypertension)   History of cerebral aneurysm repair   Right homonymous hemianopsia/chronic   Discharge Condition:  Stable and improved. Will discharge home with instructions to follow with PCP in 10 days  Diet recommendation: heart healthy diet  Filed Weights   12/17/14 1308 12/17/14 1835  Weight: 86.268 kg (190 lb 3 oz) 88.043 kg (194 lb 1.6 oz)    History of present illness:  54 y.o. male, history of stroke and prior cerebral aneurysm repair as well as seizure disorder and hypertension. Also has a history of chronic stable right homonymous hemianopsia. Patient reports that he has been dizzy for several months and symptoms were worse on day.of  He reports he has had some chronic dizziness with standing especially gets up too fast but typically this resolves rather quickly. On the day of admission the symptoms did not resolved as expected and promted4. Several months ago his chronic care physician started him on losartan and last week due to persistent hypertension he was started on Norvasc.Marland Kitchen He denies any vertigo type symptoms or any associated nausea with his dizziness.  Hospital Course:    Dizziness/history of CVA as well as prior cerebral aneurysm repair -no abnormalities on telemetry -2-D echo, CT head and /MRI negative for acute abnormalities -carotid dopplers with just mild bilat ICA -will use baby aspirin for  secondary prevention -also started on statins (pravachol 40 mg)  Chronic grade 1 diastolic heart failure: no signs of acute exacerbation -continue heart healthy diet (< 2.5 G daily)   Seizure disorder -Symptoms not consistent with seizure i.e. no staring or post ictal phase -Continue Dilantin; Dilantin level is therapeutic -No evidence of orthostasis    HTN  -Blood pressure per patient documentation was poorly controlled at home  -well controlled and at goal with current antihypertensive regimen -will continue  losartan and Norvasc   Right homonymous hemianopsia/chronic -Is chronic and unchanged  Procedures:  See below for x-ray reports  2-D echo Study Conclusions - Left ventricle: The cavity size was normal. Systolic function was normal. The estimated ejection fraction was in the range of 55% to 60%. Wall motion was normal; there were no regional wall motion abnormalities. There was an increased relative contribution of atrial contraction to ventricular filling. Doppler parameters are consistent with abnormal left ventricular relaxation (grade 1 diastolic dysfunction).   Carotid doppler: - The right vertebral artery appears patent with antegrade flow. - Left vertebral not visualized. - Findings consistent with 1-39 percent stenosis involving the right internal carotid artery and the left internal carotid artery.  Consultations:  Neurology   Discharge Exam: Filed Vitals:   12/19/14 0955  BP: 114/81  Pulse: 103  Temp: 97.8 F (36.6 C)  Resp: 20    General: afebrile, no acute complaints. Denies CP, SOB or any focal neuro/motor deficits Cardiovascular: S1 and S2, no rubs or gallops Respiratory: CTA bilaterally Abd: soft, NT, ND, positive BS Extremities: no edema or cyanosis  Neurologic exam: no focal deficit  Discharge Instructions   Discharge Instructions    Diet - low sodium heart healthy    Complete by:  As directed      Discharge  instructions    Complete by:  As directed   Follow a heart healthy diet (low sodium and low fat diet) Arrange follow up with PCP in 10 days Take medications as prescribed Keep yourself well hydrated          Current Discharge Medication List    START taking these medications   Details  aspirin EC 81 MG EC tablet Take 1 tablet (81 mg total) by mouth daily.    pravastatin (PRAVACHOL) 40 MG tablet Take 1 tablet (40 mg total) by mouth daily. Qty: 30 tablet, Refills: 1      CONTINUE these medications which have NOT CHANGED   Details  amLODipine (NORVASC) 5 MG tablet Take 5 mg by mouth daily.    calcium carbonate (OS-CAL) 600 MG TABS Take 600 mg by mouth daily.    DILANTIN 100 MG ER capsule Take 3 capsules (300 mg total) by mouth daily. Do not mail until patient calls Qty: 270 capsule, Refills: 6    fluticasone (FLONASE) 50 MCG/ACT nasal spray Place 1 spray into both nostrils daily as needed for allergies or rhinitis.    losartan (COZAAR) 50 MG tablet Take 100 mg by mouth daily.     Multiple Vitamin (MULTIVITAMIN) capsule Take 1 capsule by mouth daily. am    oxyCODONE-acetaminophen (PERCOCET/ROXICET) 5-325 MG per tablet Take 2 tablets by mouth every 4 (four) hours as needed for severe pain. Qty: 20 tablet, Refills: 0      STOP taking these medications     azithromycin (ZITHROMAX) 250 MG tablet        No Known Allergies   The results of significant diagnostics from this hospitalization (including imaging, microbiology, ancillary and laboratory) are listed below for reference.    Significant Diagnostic Studies: Ct Head (brain) Wo Contrast  12/17/2014   CLINICAL DATA:  Dizziness  EXAM: CT HEAD WITHOUT CONTRAST  TECHNIQUE: Contiguous axial images were obtained from the base of the skull through the vertex without intravenous contrast.  COMPARISON:  11/13/2011  FINDINGS: No skull fracture is noted. Again noted status post right frontotemporal craniotomy. Again noted multiple  surgical clips post AVM clipping. Encephalomalacia in left parietal and left occipital lobe is stable. Old lacunar infarct in left thalamus is stable. No acute cortical infarction. No mass lesion is noted on this unenhanced scan. Again noted surgical clips in right suprasellar region. Stable postsurgical changes left posterior parietal skull  IMPRESSION: No acute intracranial abnormality. Stable postsurgical changes and prior craniotomy as described above. Stable encephalomalacia in left posterior parietal and left occipital lobe.   Electronically Signed   By: Lahoma Crocker M.D.   On: 12/17/2014 15:12   Mr Jodene Nam Head Wo Contrast  12/18/2014   CLINICAL DATA:  Drifting to the left when walking yesterday. Symptoms have since resolved. Possible stroke. 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  EXAM: MRA HEAD WITHOUT CONTRAST  TECHNIQUE: Angiographic images of the Circle of Willis were obtained using MRA technique without intravenous contrast.  COMPARISON:  Head MRI 12/17/2014 and MRA 11/14/2011.  FINDINGS: The visualized distal right vertebral artery is patent and supplies the basilar. Right PICA appears patent although its origin was not imaged. No flow is identified in the distal left vertebral artery consistent with previously described occlusion. AICA origins are  patent. Basilar artery appears patent, although the distal basilar artery is obscured by artifact from prior aneurysm clipping, unchanged. Right SCA appears patent, although it is obscured proximally. Proximal PCAs, including their origins, are obscured by artifact. Flow seen in the P2 segments.  Internal carotid arteries are patent from skullbase to carotid termini. No ICA stenosis is identified, however the right supraclinoid ICA is partially obscured by artifact from the basilar artery aneurysm clip. There is a small, patent anterior communicating artery. ACAs and MCAs are unremarkable aside from  mild branch vessel irregularity. No new or recurrent aneurysm is identified within the limitations imposed by artifact.  IMPRESSION: 1. 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 2. Prior distal basilar artery aneurysm clipping with limited evaluation of this region due to artifact as above, unchanged. 3. No evidence of major arterial occlusion or significant proximal stenosis involving the anterior circulation.   Electronically Signed   By: Logan Bores   On: 12/18/2014 11:45   Mr Brain Wo Contrast (neuro Protocol)  12/17/2014   CLINICAL DATA:  Acute onset dizziness today. History of aneurysm clipping, hypertension, stroke, migraine headache, seizure. Known of LEFT vertebral artery occlusion, status post treated LEFT parietal occipital AVM.  EXAM: MRI HEAD WITHOUT CONTRAST  TECHNIQUE: Multiplanar, multiecho pulse sequences of the brain and surrounding structures were obtained without intravenous contrast.  COMPARISON:  CT of the head December 17, 2014 at 1439 hours and MRI of the head July 12, 2012  FINDINGS: No reduced diffusion to suggest acute ischemia; quite ill material and aneurysm clip resultant susceptibility artifact somewhat decreasing sensitivity. Multifocal susceptibility artifact corresponding to surgical hardware, including calvarial suture material. No definite intraparenchymal  Stable RIGHT mesial temporal lobe and RIGHT frontal encephalomalacia. Stable LEFT parietal occipital large area of encephalomalacia with susceptibility artifact consistent with embolic material. Ex vacuo dilatation of LEFT occipital horn, atrium. No hydrocephalus. No midline shift. Mild white matter changes exclusive of the aforementioned abnormality likely reflect chronic small vessel ischemic disease. Remote LEFT thalamus lacunar infarct. No hydrocephalus. Mild global parenchymal brain volume loss for age.  No abnormal extra-axial fluid collections. Nonvisualized LEFT vertebral artery flow void consistent  with prior imaging. Ocular globes and orbital contents are unremarkable. Mild frontoethmoidal mucosal thickening without paranasal sinus air-fluid levels. The mastoid air cells are well aerated. No abnormal sellar expansion. No cerebellar tonsillar ectopia.  IMPRESSION: No acute intracranial process.  Susceptibility artifact from aneurysm clipping and LEFT parietal occipital embolic material within AVM limits evaluation though, is overall stable from prior MRI.  Stable treated LEFT parietal occipital AVM. Additional multifocal encephalomalacia may be posttraumatic or ischemic, similar. Mild white matter changes suggest chronic small vessel ischemic disease.   Electronically Signed   By: Elon Alas   On: 12/17/2014 22:41    Labs: Basic Metabolic Panel:  Recent Labs Lab 12/17/14 1329  NA 139  K 3.8  CL 103  CO2 27  GLUCOSE 96  BUN 12  CREATININE 0.93  CALCIUM 9.2   Liver Function Tests:  Recent Labs Lab 12/17/14 1329  AST 20  ALT 25  ALKPHOS 114  BILITOT 0.8  PROT 7.1  ALBUMIN 4.4   CBC:  Recent Labs Lab 12/17/14 1329  WBC 4.0  NEUTROABS 2.5  HGB 15.1  HCT 43.0  MCV 95.8  PLT 184   CBG:  Recent Labs Lab 12/17/14 1406  GLUCAP 80    Signed:  Barton Dubois  [facility] 12/19/2014, 10:56 AM

## 2014-12-21 DIAGNOSIS — R269 Unspecified abnormalities of gait and mobility: Secondary | ICD-10-CM | POA: Insufficient documentation

## 2014-12-24 ENCOUNTER — Telehealth: Payer: Self-pay | Admitting: Adult Health

## 2014-12-24 ENCOUNTER — Other Ambulatory Visit: Payer: Self-pay

## 2014-12-24 MED ORDER — DILANTIN 100 MG PO CAPS: 300.0000 mg | ORAL_CAPSULE | Freq: Every day | ORAL | Status: DC

## 2014-12-24 NOTE — Telephone Encounter (Signed)
Patient requesting refill for Rx DILANTIN 100 MG ER capsule forwarded to [facility] for 90 day supply.  Patient schedule to see Jinny Blossom, NP 05/01/15.  Please call and advise.

## 2014-12-24 NOTE — Telephone Encounter (Signed)
Former Occupational psychologist patient.  Has appt scheduled with NP.  Refill sent under [facility] since new MD has not yet been assigned.  I called the patient back, got no answer.  Left message.

## 2014-12-24 NOTE — Telephone Encounter (Signed)
Patient called back and said he needed Rx sent to [facility] instead of [facility].  Rx has been resent.  Patient is aware.

## 2015-05-01 ENCOUNTER — Ambulatory Visit: Payer: 59 | Admitting: Adult Health

## 2015-05-08 ENCOUNTER — Ambulatory Visit: Payer: 59 | Admitting: Neurology

## 2015-05-15 ENCOUNTER — Encounter: Payer: Self-pay | Admitting: Adult Health

## 2015-05-15 ENCOUNTER — Ambulatory Visit (INDEPENDENT_AMBULATORY_CARE_PROVIDER_SITE_OTHER): Payer: 59 | Admitting: Adult Health

## 2015-05-15 VITALS — BP 145/89 | HR 72 | Ht 71.0 in | Wt 188.0 lb

## 2015-05-15 DIAGNOSIS — R569 Unspecified convulsions: Secondary | ICD-10-CM

## 2015-05-15 MED ORDER — DILANTIN 100 MG PO CAPS: 300.0000 mg | ORAL_CAPSULE | Freq: Every day | ORAL | Status: DC

## 2015-05-15 NOTE — Progress Notes (Signed)
I have read the note, and I agree with the clinical assessment and plan.  WILLIS,CHARLES KEITH   

## 2015-05-15 NOTE — Patient Instructions (Signed)
Continue Dilantin If your symptoms worsen or you develop new symptoms please let us know.

## 2015-05-15 NOTE — Progress Notes (Addendum)
PATIENT: Eric Craig DOB: 11-14-1960  REASON FOR VISIT: follow up HISTORY FROM: patient  HISTORY OF PRESENT ILLNESS: Eric Craig is a 54 year old male with a history of seizures. He returns today for follow-up. The patient continues to take Dilantin and tolerates it well. He denies any seizure events. Denies any changes with his gait or balance. He states that his balance has been affected after an aneurysm. The patient follows up with regular dental appointments. He denies any new symptoms. The patient states that his wife recently passed away from lung cancer 2 weeks ago. He returns today for an evaluation.  HISTORY 05/07/14 ([place]): Eric Craig is a 54 y.o. male here as a follow up. He has a history of seizure disorder. Last visit was 10/2013 , at this time seizures were stable. We attempted to switch him to [place] but he did not tolerate and returned to dilantin. Has been tolerating well. No seizure episodes. Overall doing well. No gait instability. No noted tremor. Neck pain is resolved.    Per old records from Dr Erling Cruz Per Dr Bernardo Heater prior note from 11/08/2012:  Is a 54y/o gentleman with hx of complex partial and secondarily generalized seizures starting in 1979. Complicated personal history involving L occipital AVM s/o hemorrhage in 1997, basilar artery aneurysm, R temporal craniotomy for aneurysm surgery in 1997, invasive loop procedure for AVM treatment + removal 1997. He has a R homonymous hemianopsia. Has recurrent cervical radiculoapthy s/p multiple laminectomies and fusions of C4-5, C5-6 and C6-7. On 11/12/2011 developed hot flashes, dizziness and difficulty swallowing, next day felt drunk, slurred speech, could not walk, noted L facial weakness, ptosis and numbness. CT head showed no acute processes. 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   REVIEW OF SYSTEMS: Out of a complete 14 system review of symptoms, the patient complains only of the  following symptoms, and all other reviewed systems are negative.  See history of present illness  ALLERGIES: No Known Allergies  HOME MEDICATIONS: Outpatient Prescriptions Prior to Visit  Medication Sig Dispense Refill  . amLODipine (NORVASC) 5 MG tablet Take 5 mg by mouth daily.    Marland Kitchen aspirin EC 81 MG EC tablet Take 1 tablet (81 mg total) by mouth daily.    . calcium carbonate (OS-CAL) 600 MG TABS Take 600 mg by mouth daily.    Marland Kitchen DILANTIN 100 MG ER capsule Take 3 capsules (300 mg total) by mouth daily. 270 capsule 1  . fluticasone (FLONASE) 50 MCG/ACT nasal spray Place 1 spray into both nostrils daily as needed for allergies or rhinitis.    Marland Kitchen losartan (COZAAR) 50 MG tablet Take 100 mg by mouth daily.     . Multiple Vitamin (MULTIVITAMIN) capsule Take 1 capsule by mouth daily. am    . oxyCODONE-acetaminophen (PERCOCET/ROXICET) 5-325 MG per tablet Take 2 tablets by mouth every 4 (four) hours as needed for severe pain. (Patient not taking: Reported on 05/15/2015) 20 tablet 0  . pravastatin (PRAVACHOL) 40 MG tablet Take 1 tablet (40 mg total) by mouth daily. (Patient not taking: Reported on 05/15/2015) 30 tablet 1   No facility-administered medications prior to visit.    PAST MEDICAL HISTORY: Past Medical History  Diagnosis Date  . 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 Seizures     Last seizure was 2001  . Cervical radiculopathy     recurrent  . Headache(784.0)   .  Hx of migraine headaches   . Malformation     occipital arteriovenous  . Hemianopsia     residual right homoinmous  . 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 High blood pressure     PAST SURGICAL HISTORY: Past Surgical History  Procedure Laterality Date  . Neck surgery      C4-5 repair in 1989. C6-7 repair in 1993 and 2001. Residual left arm weakness  . Brain surgery      Basilar artery aneurysm repair in 1997, with facial reconstruction in 1998  . Right temporal  craniotomy  2/97    basilarr artery aneurysm  . Invasive loop procedure  [place] for treatment of the AVM   . Removal of the arteriovenous malformation  1997    [place]  . C5-c6 laminectomy  1/89  . Cervical discectomy  6/89    C5-C6-7:04/1992, C4-5, w/ plate fusion for left C5 radiculopathy 12/00    FAMILY HISTORY: Family History  Problem Relation Age of Onset  . Aortic aneurysm Mother 61  . Hypertension Father   . Heart disease Other     SOCIAL HISTORY: History   Social History  . Marital Status: Single    Spouse Name: N/A  . Number of Children: 2  . Years of Education: 12   Occupational History  . [employer] History Main Topics  . Smoking status: Former Smoker -- 1.00 packs/day for 40 years    Types: Cigarettes  . Smokeless tobacco: Never Used     Comment: Quit three years ago.  . Alcohol Use: 0.0 oz/week    0 Standard drinks or equivalent per week     Comment: Drinks 12 beers per year  . Drug Use: No  . Sexual Activity: Not on file   Other Topics Concern  . Not on file   Social History Narrative   Lives at home with his passed two weeks ago.  (cell phone number [phone]). Has 2 daughters that accompany him here today. Patient works full time at  [employer].   Patient is left handed   Education level is some vocational college   Caffeine consumption is 4 cups daily      PHYSICAL EXAM  Filed Vitals:   05/15/15 0756  BP: 145/89  Pulse: 72  Height: 5\' 11"  (1.803 m)  Weight: 188 lb (85.276 kg)   Body mass index is 26.23 kg/(m^2).  Generalized: Well developed, in no acute distress   Neurological examination  Mentation: Alert oriented to time, place, history taking. Follows all commands speech and language fluent Cranial nerve II-XII: Pupils were equal round reactive to light. Extraocular movements were full with downbeating nystagmus. Facial sensation and strength were normal. Uvula tongue midline. Head  turning and shoulder shrug  were normal and symmetric. Motor: The motor testing reveals 5 over 5 strength of all 4 extremities. Good symmetric motor tone is noted throughout.  Sensory: Sensory testing is intact to soft touch on all 4 extremities. No evidence of extinction is noted.  Coordination: Cerebellar testing reveals good finger-nose-finger and heel-to-shin bilaterally.  Gait and station: Gait is normal. Tandem gait is unsteady. Romberg is negative. No drift is seen.  Reflexes: Deep tendon reflexes are symmetric and normal bilaterally.   DIAGNOSTIC DATA (LABS, IMAGING, TESTING) - I reviewed patient records, labs, notes, testing and imaging myself where available.  Lab Results  Component Value Date   WBC 4.0 12/17/2014  HGB 15.1 12/17/2014   HCT 43.0 12/17/2014   MCV 95.8 12/17/2014   PLT 184 12/17/2014      Component Value Date/Time   NA 139 12/17/2014 1329   K 3.8 12/17/2014 1329   CL 103 12/17/2014 1329   CO2 27 12/17/2014 1329   GLUCOSE 96 12/17/2014 1329   BUN 12 12/17/2014 1329   CREATININE 0.93 12/17/2014 1329   CALCIUM 9.2 12/17/2014 1329   PROT 7.1 12/17/2014 1329   ALBUMIN 4.4 12/17/2014 1329   AST 20 12/17/2014 1329   ALT 25 12/17/2014 1329   ALKPHOS 114 12/17/2014 1329   BILITOT 0.8 12/17/2014 1329   GFRNONAA >90 12/17/2014 1329   GFRAA >90 12/17/2014 1329   Lab Results  Component Value Date   CHOL 184 12/18/2014   HDL 56 12/18/2014   LDLCALC 108* 12/18/2014   TRIG 98 12/18/2014   CHOLHDL 3.3 12/18/2014   Lab Results  Component Value Date   HGBA1C 5.4 12/18/2014      ASSESSMENT AND PLAN 54 y.o. year old male  has a past medical history of Aneurysm of anterior cerebral artery; Seizures; Cervical radiculopathy; Headache(784.0); migraine headaches; Malformation; Hemianopsia; Stroke (10/2011); and High blood pressure. here with:  1. Seizures  Overall the patient is doing well. He will continue Dilantin 100 mg 3 tablets daily. The patient recently  had blood work in March that was unremarkable. If the patient has any seizure events he should let us know. He will follow-up in 1 year or sooner if needed.  Ward Givens, MSN, NP-C 05/15/2015, 8:09 AM [facility] [address] [place], [place] [address] [phone]  Note: This document was prepared with digital dictation and possible smart phrase technology. Any transcriptional errors that result from this process are unintentional.

## 2015-05-16 ENCOUNTER — Telehealth: Payer: Self-pay | Admitting: *Deleted

## 2015-05-16 DIAGNOSIS — Z0289 Encounter for other administrative examinations: Secondary | ICD-10-CM

## 2015-05-16 NOTE — Telephone Encounter (Signed)
Form on Dana C.

## 2015-05-28 ENCOUNTER — Telehealth: Payer: Self-pay | Admitting: *Deleted

## 2015-05-28 NOTE — Telephone Encounter (Signed)
Patient form faxed to [facility] on 05/28/15.

## 2015-12-06 ENCOUNTER — Encounter (HOSPITAL_COMMUNITY): Payer: Self-pay | Admitting: Family Medicine

## 2015-12-06 ENCOUNTER — Emergency Department (HOSPITAL_COMMUNITY)
Admission: EM | Admit: 2015-12-06 | Discharge: 2015-12-06 | Disposition: A | Discharge status: Home / Self Care | Payer: 59 | Source: Home / Self Care | Attending: Family Medicine | Admitting: Family Medicine

## 2015-12-06 DIAGNOSIS — J40 Bronchitis, not specified as acute or chronic: Secondary | ICD-10-CM | POA: Diagnosis not present

## 2015-12-06 DIAGNOSIS — I10 Essential (primary) hypertension: Secondary | ICD-10-CM

## 2015-12-06 DIAGNOSIS — R0982 Postnasal drip: Secondary | ICD-10-CM | POA: Diagnosis not present

## 2015-12-06 MED ORDER — BENZONATATE 100 MG PO CAPS
100.0000 mg | ORAL_CAPSULE | Freq: Three times a day (TID) | ORAL | Status: DC | PRN
Start: 2015-12-06 — End: 2016-05-18

## 2015-12-06 MED ORDER — IPRATROPIUM BROMIDE 0.06 % NA SOLN: 2.0000 | Freq: Four times a day (QID) | NASAL | Status: DC

## 2015-12-06 MED ORDER — MUPIROCIN 2 % EX OINT: 1.0000 "application " | TOPICAL_OINTMENT | Freq: Two times a day (BID) | CUTANEOUS | Status: DC

## 2015-12-06 MED ORDER — FLUTICASONE PROPIONATE 50 MCG/ACT NA SUSP: 2.0000 | Freq: Every day | NASAL | Status: DC

## 2015-12-06 NOTE — ED Provider Notes (Signed)
CSN: [id number]     Arrival date & time 12/06/15  1819 History   First MD Initiated Contact with Patient 12/06/15 1851     No chief complaint on file.  (Consider location/radiation/quality/duration/timing/severity/associated sxs/prior Treatment) HPI  Cough:  Started 2.5 wks ago Overall improving but no improvement in some time.  Cough all night long Horse voice, no history of reflux. Started w/ sore throat which resolved after 2 days on ABX (clindamycin???).  No h/o reflux Nothing makes sx better or worse.  Stuffy nose since on set of sx.  Denies any shortness of breath, fevers, diarrhea, nausea, vomiting, sore throat actively, unintentional weight loss, night sweats. Denies any GERD symptoms.   Past Medical History  Diagnosis Date  . 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 Seizures ([place])     Last seizure was 2001  . Cervical radiculopathy     recurrent  . Headache(784.0)   . Hx of migraine headaches   . Malformation     occipital arteriovenous  . Hemianopsia     residual right homoinmous  . 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 High blood pressure    Past Surgical History  Procedure Laterality Date  . Neck surgery      C4-5 repair in 1989. C6-7 repair in 1993 and 2001. Residual left arm weakness  . Brain surgery      Basilar artery aneurysm repair in 1997, with facial reconstruction in 1998  . Right temporal craniotomy  2/97    basilarr artery aneurysm  . Invasive loop procedure  [place] for treatment of the AVM   . Removal of the arteriovenous malformation  1997    [place]  . C5-c6 laminectomy  1/89  . Cervical discectomy  6/89    C5-C6-7:04/1992, C4-5, w/ plate fusion for left C5 radiculopathy 12/00   Family History  Problem Relation Age of Onset  . Aortic aneurysm Mother 58  . Hypertension Father   . Heart disease Other    Social History  Substance Use Topics  . Smoking status:  Former Smoker -- 1.00 packs/day for 40 years    Types: Cigarettes  . Smokeless tobacco: Never Used     Comment: Quit three years ago.  . Alcohol Use: 0.0 oz/week    0 Standard drinks or equivalent per week     Comment: Drinks 12 beers per year    Review of Systems Per HPI with all other pertinent systems negative.   Allergies  Review of patient's allergies indicates no known allergies.  Home Medications   Prior to Admission medications   Medication Sig Start Date End Date Taking? Authorizing Provider  amLODipine (NORVASC) 5 MG tablet Take 5 mg by mouth daily.    Historical Provider, MD  aspirin EC 81 MG EC tablet Take 1 tablet (81 mg total) by mouth daily. 12/19/14   Barton Dubois, MD  benzonatate (TESSALON PERLES) 100 MG capsule Take 1-2 capsules (100-200 mg total) by mouth 3 (three) times daily as needed for cough. 12/06/15   Waldemar Dickens, MD  calcium carbonate (OS-CAL) 600 MG TABS Take 600 mg by mouth daily.    Historical Provider, MD  DILANTIN 100 MG ER capsule Take 3 capsules (300 mg total) by mouth daily. 05/15/15   Ward Givens, NP  fluticasone (FLONASE) 50 MCG/ACT nasal spray Place 2 sprays into both nostrils at bedtime. 12/06/15  Waldemar Dickens, MD  ipratropium (ATROVENT) 0.06 % nasal spray Place 2 sprays into both nostrils 4 (four) times daily. 12/06/15   Waldemar Dickens, MD  losartan (COZAAR) 50 MG tablet Take 100 mg by mouth daily.  04/30/14   Historical Provider, MD  Multiple Vitamin (MULTIVITAMIN) capsule Take 1 capsule by mouth daily. am    Historical Provider, MD  mupirocin ointment (BACTROBAN) 2 % Apply 1 application topically 2 (two) times daily. 12/06/15   Waldemar Dickens, MD   Meds Ordered and Administered this Visit  Medications - No data to display  BP 152/102 mmHg  Pulse 79  Temp(Src) 98.1 F (36.7 C) (Oral)  Resp 18  SpO2 95% No data found.   Physical Exam Physical Exam  Constitutional: oriented to person, place, and time. appears well-developed and  well-nourished. No distress.  HENT: Very boggy and congested nasal turbinates, pharyngeal cobblestoning. Head: Normocephalic and atraumatic.  Eyes: EOMI. PERRL.  Neck: Normal range of motion.  Cardiovascular: RRR, no m/r/g, 2+ distal pulses,  Pulmonary/Chest: Effort normal and breath sounds normal. No respiratory distress.  Abdominal: Soft. Bowel sounds are normal. NonTTP, no distension.  Musculoskeletal: Normal range of motion. Non ttp, no effusion.  Neurological: alert and oriented to person, place, and time.  Skin: Skin is warm. No rash noted. non diaphoretic.  Psychiatric: normal mood and affect. behavior is normal. Judgment and thought content normal.   ED Course  Procedures (including critical care time)  Labs Review Labs Reviewed - No data to display  Imaging Review No results found.   Visual Acuity Review  Right Eye Distance:   Left Eye Distance:   Bilateral Distance:    Right Eye Near:   Left Eye Near:    Bilateral Near:         MDM   1. Post-nasal drip   2. Bronchitis   3. Essential hypertension    Start Flonase, Zyrtec, NSAIDs, nasal Atrovent, Tessalon, mupirocin ointment for intranasal treatment. Continue antihypertensive medications. No need for antibiotics at this time.    Waldemar Dickens, MD 12/06/15 [id number]

## 2015-12-06 NOTE — ED Notes (Signed)
C/o cold sx onset 2-3 weeks... See physicians notes A&O x4... No acute distress.

## 2015-12-06 NOTE — Discharge Instructions (Signed)
Your symptoms are likely due to a combination of mild rhonchi this, or inflammation of the lungs. This will take another one to 3 weeks in order to resolve as the lungs continue to heal.  Your symptoms may also be due in part to postnasal drip causing constant irritation of the lungs. In order to improve this you should consider using the following medications, Flonase or Rhinocort at night before bed, a nightly allergy pill such as Zyrtec or Claritin or Allegra, nasal Atrovent to help dry it nasal secretions during the day. He may also consider using Mucinex to help break up lung secretions and Tessalon Perles to help dampen your cough. Please go to the emergency room if you get worse.  There is no evidence of pneumonia, strep throat, or fluid this time.

## 2016-01-16 ENCOUNTER — Encounter: Payer: Self-pay | Admitting: Sports Medicine

## 2016-01-16 ENCOUNTER — Ambulatory Visit (INDEPENDENT_AMBULATORY_CARE_PROVIDER_SITE_OTHER): Payer: 59 | Admitting: Sports Medicine

## 2016-01-16 DIAGNOSIS — L603 Nail dystrophy: Secondary | ICD-10-CM

## 2016-01-16 DIAGNOSIS — M79674 Pain in right toe(s): Secondary | ICD-10-CM | POA: Diagnosis not present

## 2016-01-16 DIAGNOSIS — L600 Ingrowing nail: Secondary | ICD-10-CM

## 2016-01-16 NOTE — Patient Instructions (Signed)

## 2016-01-16 NOTE — Progress Notes (Signed)
Patient ID: Eric Craig, male   DOB: [birth year], 55 y.o.   MRN: [id number] Subjective: Eric Craig is a 55 y.o. male patient presents to office today complaining of a painful incurvated, lateral greater than medial nail border, right first toe. This has been present for several years with fungal changes and debris, noted. Patient has treated this by trimming without improvement in nail presentation and ingrowing. Patient denies fever/chills/nausea/vomitting/any other related constitutional symptoms at this time.  Patient Active Problem List   Diagnosis Date Noted  . Gait abnormality   . History of cerebral aneurysm repair 12/17/2014  . Right homonymous hemianopsia/chronic 12/17/2014  . Dizziness 12/17/2014  . HTN (hypertension)   . 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 History of stroke 11/13/2011  . Seizure disorder ([place]) 11/13/2011    Current Outpatient Prescriptions on File Prior to Visit  Medication Sig Dispense Refill  . amLODipine (NORVASC) 5 MG tablet Take 5 mg by mouth daily.    Marland Kitchen aspirin EC 81 MG EC tablet Take 1 tablet (81 mg total) by mouth daily.    . benzonatate (TESSALON PERLES) 100 MG capsule Take 1-2 capsules (100-200 mg total) by mouth 3 (three) times daily as needed for cough. 30 capsule 0  . calcium carbonate (OS-CAL) 600 MG TABS Take 600 mg by mouth daily.    Marland Kitchen DILANTIN 100 MG ER capsule Take 3 capsules (300 mg total) by mouth daily. 270 capsule 3  . fluticasone (FLONASE) 50 MCG/ACT nasal spray Place 2 sprays into both nostrils at bedtime. 16 g 0  . ipratropium (ATROVENT) 0.06 % nasal spray Place 2 sprays into both nostrils 4 (four) times daily. 15 mL 12  . losartan (COZAAR) 50 MG tablet Take 100 mg by mouth daily.     . Multiple Vitamin (MULTIVITAMIN) capsule Take 1 capsule by mouth daily. am    . mupirocin ointment (BACTROBAN) 2 % Apply 1 application topically 2 (two) times daily. 22 g 0   No current facility-administered medications on file prior to visit.     No Known Allergies  Objective:  There were no vitals filed for this visit.  General: Well developed, nourished, in no acute distress, alert and oriented x3   Dermatology: Skin is warm, dry and supple bilateral. Right hallux nail appears to be  severely thickened, discolored with significant subungual debris to total nail resembling possible fungus with moderate incurvation at the lateral greater than medial nail border. (-) Erythema. (-) Edema. (-) serosanguous drainage present. There is also mild thickening and subungual debris noted at the lateral aspect of the left hallux nail resembling possible fungus. The remaining nails appear unremarkable at this time. There are no open sores, lesions or other signs of infection  present.  Vascular: Dorsalis Pedis artery and Posterior Tibial artery pedal pulses are 2/4 bilateral with immedate capillary fill time. Pedal hair growth present. No lower extremity edema.   Neruologic: Grossly intact via light touch bilateral.  Musculoskeletal: Tenderness to palpation of the right hallux lateral greater than medial nail fold. Muscular strength within normal limits in all groups bilateral.   Assesement and Plan: Problem List Items Addressed This Visit    None    Visit Diagnoses    Nail dystrophy    -  Primary    Probable fungus    Relevant Orders    Fungus Culture with Smear    Toe pain, right        Ingrowing nail           -  Discussed treatment alternatives and plan of care; Explained permanent/temporary nail avulsion and post procedure course to patient. Also explained to patient, need for fungal culture or changes at both hallux nails. Patient opt for total temporary right hallux nail avulsion and fungal culture.  - After a verbal consent, injected 3 ml of a 50:50 mixture of 2% plain  lidocaine and 0.5% plain marcaine in a normal right hallux block fashion. Next, a  betadine prep was performed. Anesthesia was tested and found to be  appropriate.  The right hallux nail was then incised from the hyponychium to the epinychium. The right hallux nail in total was removed and cleared from the field.  The area was then flushed with alcohol and dressed with antibiotic cream and a dry sterile dressing. -Patient was instructed to leave the dressing intact for today and begin soaking  in a weak solution of betadine and water tomorrow. Patient was instructed to  soak for 15 minutes each day and apply neosporin and a gauze or bandaid dressing each day. -Advised patient if painful to ice, elevate and to take Tylenol or Motrin as needed for pain. Patient informed me that he will be traveling for the weekend to [place], but didn't returning. I advised patient, since he will be traveling that is very pertinent to rest and elevate as much as possible to decrease episodes of pain and swelling provided the patient with ice pack to take with him to travel and strongly advised to monitor toe for signs of infection. If happens to go to urgent care or ER immediately or return to office -Fungal culture was obtained from left and the right hallux nail was also sent that was removed during today's avulsion procedure to [facility]. We'll discuss fungal culture result with patient in about a month when the results from the fungal culture are available. -Patient is to return in 1 week for follow up care/right hallux nailbed check or sooner if problems arise.  Landis Martins, DPM

## 2016-01-23 ENCOUNTER — Ambulatory Visit (INDEPENDENT_AMBULATORY_CARE_PROVIDER_SITE_OTHER): Payer: 59 | Admitting: Sports Medicine

## 2016-01-23 ENCOUNTER — Encounter: Payer: Self-pay | Admitting: Sports Medicine

## 2016-01-23 DIAGNOSIS — L600 Ingrowing nail: Secondary | ICD-10-CM

## 2016-01-23 DIAGNOSIS — L603 Nail dystrophy: Secondary | ICD-10-CM

## 2016-01-23 DIAGNOSIS — M79674 Pain in right toe(s): Secondary | ICD-10-CM

## 2016-01-23 NOTE — Progress Notes (Signed)
Patient ID: [name], male   DOB: 1960-11-06, 55 y.o.   MRN: [id number] Subjective: Eric Craig is a 55 y.o. male patient returns to office today for follow up evaluation after having Right Hallux total temporary nail avulsion performed on 01/16/2016. Patient has been soaking using betadine and applying topical antibiotic covered with bandaid daily. Patient deniesfever/chills/nausea/vomitting/any other related constitutional symptoms at this time.  Patient Active Problem List   Diagnosis Date Noted  . Gait abnormality   . History of cerebral aneurysm repair 12/17/2014  . Right homonymous hemianopsia/chronic 12/17/2014  . Dizziness 12/17/2014  . HTN (hypertension)   . 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 History of stroke 11/13/2011  . Seizure disorder ([place]) 11/13/2011    Current Outpatient Prescriptions on File Prior to Visit  Medication Sig Dispense Refill  . amLODipine (NORVASC) 5 MG tablet Take 5 mg by mouth daily.    Marland Kitchen aspirin EC 81 MG EC tablet Take 1 tablet (81 mg total) by mouth daily.    . benzonatate (TESSALON PERLES) 100 MG capsule Take 1-2 capsules (100-200 mg total) by mouth 3 (three) times daily as needed for cough. 30 capsule 0  . calcium carbonate (OS-CAL) 600 MG TABS Take 600 mg by mouth daily.    Marland Kitchen CIALIS 20 MG tablet   2  . DILANTIN 100 MG ER capsule Take 3 capsules (300 mg total) by mouth daily. 270 capsule 3  . fluticasone (FLONASE) 50 MCG/ACT nasal spray Place 2 sprays into both nostrils at bedtime. 16 g 0  . ipratropium (ATROVENT) 0.06 % nasal spray Place 2 sprays into both nostrils 4 (four) times daily. 15 mL 12  . losartan (COZAAR) 50 MG tablet Take 100 mg by mouth daily.     Marland Kitchen losartan-hydrochlorothiazide (HYZAAR) 100-25 MG tablet     . Multiple Vitamin (MULTIVITAMIN) capsule Take 1 capsule by mouth daily. am    . mupirocin ointment (BACTROBAN) 2 % Apply 1 application topically 2 (two) times daily. 22 g 0   No current facility-administered medications  on file prior to visit.    No Known Allergies  Objective:  General: Well developed, nourished, in no acute distress, alert and oriented x3   Dermatology: Skin is warm, dry and supple bilateral. Right hallux nail bed appears to be clean, dry, with mild granular tissue and surrounding eschar/scab and maceration. (-) Erythema. (-) Edema. (-) serosanguous drainage present. The remaining nails appear unremarkable at this time. There are no other lesions or other signs of infection present.  Neurovascular status: Intact. No lower extremity swelling; No pain with calf compression bilateral.  Musculoskeletal: Decreased tenderness to palpation of the Right hallux nail bed. Muscular strength within normal limits bilateral.   Assesement and Plan: Problem List Items Addressed This Visit    None    Visit Diagnoses    Nail dystrophy    -  Primary    awaiting fungal culture results    Ingrowing nail        S/p total temporary right hallux nail avulsion 01/16/2016    Toe pain, right           -Examined patient  -Cleansed right hallux nail bed and gently scrubbed with peroxide and applied antibiotic cream covered with bandaid.  -Discussed plan of care with patient. -Patient to now begin soaking in a weak solution of Epsom salt and warm water. Patient was instructed to soak for 15-20 minutes each day until the toe appears normal and there is no drainage, redness,  tenderness, or swelling at the procedure site, and apply neosporin and a gauze or bandaid dressing each day as needed. May leave open to air at night. -Educated patient on long term care after nail surgery. -Will call patient and discuss over the phone results of fungal culture and prescribed medication as indicated -Patient was instructed to monitor the toe for reoccurrence and signs of infection; Patient advised to return to office or got to ER if toe becomes red, hot or swollen. -Patient is to return as needed or sooner if problems  arise.  Landis Martins, DPM

## 2016-01-30 ENCOUNTER — Telehealth: Payer: Self-pay | Admitting: Sports Medicine

## 2016-01-30 DIAGNOSIS — B351 Tinea unguium: Secondary | ICD-10-CM

## 2016-01-30 MED ORDER — TERBINAFINE HCL 250 MG PO TABS: 250.0000 mg | ORAL_TABLET | Freq: Every day | ORAL | Status: DC

## 2016-01-30 NOTE — Telephone Encounter (Signed)
Fungal culture positive. Recent labs within normal limits. Sent Lamisil to patient pharmacy. Patient to schedule follow up for 6 weeks for re-evaluation. -Dr. Cannon Kettle

## 2016-03-03 ENCOUNTER — Encounter: Payer: Self-pay | Admitting: Sports Medicine

## 2016-04-01 ENCOUNTER — Other Ambulatory Visit: Payer: Self-pay | Admitting: Sports Medicine

## 2016-04-07 ENCOUNTER — Ambulatory Visit (HOSPITAL_COMMUNITY)
Admission: EM | Admit: 2016-04-07 | Discharge: 2016-04-07 | Disposition: A | Discharge status: Home / Self Care | Payer: 59 | Attending: Family Medicine | Admitting: Family Medicine

## 2016-04-07 ENCOUNTER — Encounter (HOSPITAL_COMMUNITY): Payer: Self-pay | Admitting: Emergency Medicine

## 2016-04-07 DIAGNOSIS — J302 Other seasonal allergic rhinitis: Secondary | ICD-10-CM

## 2016-04-07 DIAGNOSIS — H6983 Other specified disorders of Eustachian tube, bilateral: Secondary | ICD-10-CM | POA: Diagnosis not present

## 2016-04-07 DIAGNOSIS — H9202 Otalgia, left ear: Secondary | ICD-10-CM | POA: Diagnosis not present

## 2016-04-07 DIAGNOSIS — T700XXA Otitic barotrauma, initial encounter: Secondary | ICD-10-CM

## 2016-04-07 MED ORDER — PREDNISONE 50 MG PO TABS: ORAL_TABLET | ORAL | Status: DC

## 2016-04-07 NOTE — ED Notes (Signed)
The patient presented to the [facility] with a complaint of left ear pain and fullness that started about 2 weeks ago.

## 2016-04-07 NOTE — ED Provider Notes (Signed)
CSN: [id number]     Arrival date & time 04/07/16  1928 History   First MD Initiated Contact with Patient 04/07/16 2002     Chief Complaint  Patient presents with  . Otalgia   (Consider location/radiation/quality/duration/timing/severity/associated sxs/prior Treatment) HPI Comments: 55 year old male presents to the urgent care with discomfort and decreased hearing in the left ear for the past 2 weeks. He believes that there was wax or some other foreign body in the left ear. His wife states that he has a history of allergies but is not been taking his medicines recently. He admits to having PND and clearing his throat. Denies fever, chills or other systemic symptoms.   Past Medical History  Diagnosis Date  . 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 Seizures ([place])     Last seizure was 2001  . Cervical radiculopathy     recurrent  . Headache(784.0)   . Hx of migraine headaches   . Malformation     occipital arteriovenous  . Hemianopsia     residual right homoinmous  . 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 High blood pressure    Past Surgical History  Procedure Laterality Date  . Neck surgery      C4-5 repair in 1989. C6-7 repair in 1993 and 2001. Residual left arm weakness  . Brain surgery      Basilar artery aneurysm repair in 1997, with facial reconstruction in 1998  . Right temporal craniotomy  2/97    basilarr artery aneurysm  . Invasive loop procedure  [place] for treatment of the AVM   . Removal of the arteriovenous malformation  1997    [place]  . C5-c6 laminectomy  1/89  . Cervical discectomy  6/89    C5-C6-7:04/1992, C4-5, w/ plate fusion for left C5 radiculopathy 12/00   Family History  Problem Relation Age of Onset  . Aortic aneurysm Mother 64  . Hypertension Father   . Heart disease Other    Social History  Substance Use Topics  . Smoking status: Former Smoker -- 1.00 packs/day for 40  years    Types: Cigarettes  . Smokeless tobacco: Never Used     Comment: Quit three years ago.  . Alcohol Use: 0.0 oz/week    0 Standard drinks or equivalent per week     Comment: Drinks 12 beers per year    Review of Systems  Constitutional: Negative.  Negative for diaphoresis and fatigue.  HENT: Positive for ear pain and postnasal drip. Negative for ear discharge, facial swelling, rhinorrhea, sore throat and trouble swallowing.   Eyes: Negative for pain, discharge and redness.  Respiratory: Negative for cough, chest tightness and shortness of breath.   Cardiovascular: Negative.   Gastrointestinal: Negative.   Musculoskeletal: Negative.  Negative for neck pain and neck stiffness.  Neurological: Negative.   All other systems reviewed and are negative.   Allergies  Review of patient's allergies indicates no known allergies.  Home Medications   Prior to Admission medications   Medication Sig Start Date End Date Taking? Authorizing Provider  amLODipine (NORVASC) 5 MG tablet Take 5 mg by mouth daily.   Yes Historical Provider, MD  DILANTIN 100 MG ER capsule Take 3 capsules (300 mg total) by mouth daily. 05/15/15  Yes Ward Givens, NP  losartan (COZAAR) 50 MG tablet Take 100 mg by mouth daily.  04/30/14  Yes  Historical Provider, MD  Multiple Vitamin (MULTIVITAMIN) capsule Take 1 capsule by mouth daily. am   Yes Historical Provider, MD  aspirin EC 81 MG EC tablet Take 1 tablet (81 mg total) by mouth daily. 12/19/14   Barton Dubois, MD  benzonatate (TESSALON PERLES) 100 MG capsule Take 1-2 capsules (100-200 mg total) by mouth 3 (three) times daily as needed for cough. 12/06/15   Waldemar Dickens, MD  calcium carbonate (OS-CAL) 600 MG TABS Take 600 mg by mouth daily.    Historical Provider, MD  CIALIS 20 MG tablet  10/26/15   Historical Provider, MD  fluticasone (FLONASE) 50 MCG/ACT nasal spray Place 2 sprays into both nostrils at bedtime. 12/06/15   Waldemar Dickens, MD  ipratropium (ATROVENT)  0.06 % nasal spray Place 2 sprays into both nostrils 4 (four) times daily. 12/06/15   Waldemar Dickens, MD  losartan-hydrochlorothiazide [facility]) 100-25 MG tablet  12/18/15   Historical Provider, MD  mupirocin ointment (BACTROBAN) 2 % Apply 1 application topically 2 (two) times daily. 12/06/15   Waldemar Dickens, MD  predniSONE (DELTASONE) 50 MG tablet 1 tab po on day 1, one tab po on day 2 and 3 and 1/2 tab on day 4 and 5.Take with food. 04/07/16   Janne Napoleon, NP  terbinafine (LAMISIL) 250 MG tablet Take 1 tablet (250 mg total) by mouth daily. 01/30/16   Landis Martins, DPM   Meds Ordered and Administered this Visit  Medications - No data to display  BP 138/98 mmHg  Pulse 75  Temp(Src) 98 F (36.7 C) (Oral)  Resp 16  SpO2 96% No data found.   Physical Exam  Constitutional: He is oriented to person, place, and time. He appears well-developed and well-nourished. No distress.  HENT:  Bilateral TMs are retracted, left greater than right. No erythema or effusion. Noted other discoloration. EACs are clear. No excess cerumen. No foreign bodies.  Oropharynx with minor erythema, cobblestoning and clear PND.  Eyes: EOM are normal.  Neck: Normal range of motion. Neck supple.  Cardiovascular: Normal rate and regular rhythm.   Pulmonary/Chest: Effort normal and breath sounds normal. No respiratory distress.  Musculoskeletal: Normal range of motion. He exhibits no edema.  Lymphadenopathy:    He has no cervical adenopathy.  Neurological: He is alert and oriented to person, place, and time.  Skin: Skin is warm and dry. No rash noted.  Psychiatric: He has a normal mood and affect.  Nursing note and vitals reviewed.   ED Course  Procedures (including critical care time)  Labs Review Labs Reviewed - No data to display  Imaging Review No results found.   Visual Acuity Review  Right Eye Distance:   Left Eye Distance:   Bilateral Distance:    Right Eye Near:   Left Eye Near:    Bilateral  Near:         MDM   1. Barotitis media, initial encounter   2. ETD (eustachian tube dysfunction), bilateral   3. Otalgia of left ear   4. Other seasonal allergic rhinitis    Take an antihistamine such as Allegra, Zyrtec or Claritin on a daily basis. If you need something stronger you may take Chlor-Trimeton which is chlorpheniramine 2 or 4 mg every 4-6 hours if needed for drainage. This may cause drowsiness. Sudafed PE 10 mg every 4-6 hours for congestion. Meds ordered this encounter  Medications  . predniSONE (DELTASONE) 50 MG tablet    Sig: 1 tab po on day 1,  one tab po on day 2 and 3 and 1/2 tab on day 4 and 5.Take with food.    Dispense:  4 tablet    Refill:  0    Order Specific Question:  Supervising Provider    Answer:  Garrette Fischer [[id number]       Janne Napoleon, NP 04/07/16 2035

## 2016-04-07 NOTE — Discharge Instructions (Signed)
Barotitis Media Take an antihistamine such as Allegra, Zyrtec or Claritin on a daily basis. If you need something stronger you may take Chlor-Trimeton which is chlorpheniramine 2 or 4 mg every 4-6 hours if needed for drainage. This may cause drowsiness. Sudafed PE 10 mg every 4-6 hours for congestion. Barotitis media is inflammation of your middle ear. This occurs when the auditory tube (eustachian tube) leading from the back of your nose (nasopharynx) to your eardrum is blocked. This blockage may result from a cold, environmental allergies, or an upper respiratory infection. Unresolved barotitis media may lead to damage or hearing loss (barotrauma), which may become permanent. HOME CARE INSTRUCTIONS   Use medicines as recommended by your health care provider. Over-the-counter medicines will help unblock the canal and can help during times of air travel.  Do not put anything into your ears to clean or unplug them. Eardrops will not be helpful.  Do not swim, dive, or fly until your health care provider says it is all right to do so. If these activities are necessary, chewing gum with frequent, forceful swallowing may help. It is also helpful to hold your nose and gently blow to pop your ears for equalizing pressure changes. This forces air into the eustachian tube.  Only take over-the-counter or prescription medicines for pain, discomfort, or fever as directed by your health care provider.  A decongestant may be helpful in decongesting the middle ear and make pressure equalization easier. SEEK MEDICAL CARE IF:  You experience a serious form of dizziness in which you feel as if the room is spinning and you feel nauseated (vertigo).  Your symptoms only involve one ear. SEEK IMMEDIATE MEDICAL CARE IF:   You develop a severe headache, dizziness, or severe ear pain.  You have bloody or pus-like drainage from your ears.  You develop a fever.  Your problems do not improve or become worse. MAKE  SURE YOU:   Understand these instructions.  Will watch your condition.  Will get help right away if you are not doing well or get worse.   This information is not intended to replace advice given to you by your health care provider. Make sure you discuss any questions you have with your health care provider.   Document Released: 09/25/2000 Document Revised: 07/19/2013 Document Reviewed: 04/25/2013 Elsevier Interactive Patient Education 2016 [employer]  Allergic Rhinitis Allergic rhinitis is when the mucous membranes in the nose respond to allergens. Allergens are particles in the air that cause your body to have an allergic reaction. This causes you to release allergic antibodies. Through a chain of events, these eventually cause you to release histamine into the blood stream. Although meant to protect the body, it is this release of histamine that causes your discomfort, such as frequent sneezing, congestion, and an itchy, runny nose.  CAUSES Seasonal allergic rhinitis (hay fever) is caused by pollen allergens that may come from grasses, trees, and weeds. Year-round allergic rhinitis (perennial allergic rhinitis) is caused by allergens such as house dust mites, pet dander, and mold spores. SYMPTOMS  Nasal stuffiness (congestion).  Itchy, runny nose with sneezing and tearing of the eyes. DIAGNOSIS Your health care provider can help you determine the allergen or allergens that trigger your symptoms. If you and your health care provider are unable to determine the allergen, skin or blood testing may be used. Your health care provider will diagnose your condition after taking your health history and performing a physical exam. Your health care provider may assess  you for other related conditions, such as asthma, pink eye, or an ear infection. TREATMENT Allergic rhinitis does not have a cure, but it can be controlled by:  Medicines that block allergy symptoms. These may include allergy  shots, nasal sprays, and oral antihistamines.  Avoiding the allergen. Hay fever may often be treated with antihistamines in pill or nasal spray forms. Antihistamines block the effects of histamine. There are over-the-counter medicines that may help with nasal congestion and swelling around the eyes. Check with your health care provider before taking or giving this medicine. If avoiding the allergen or the medicine prescribed do not work, there are many new medicines your health care provider can prescribe. Stronger medicine may be used if initial measures are ineffective. Desensitizing injections can be used if medicine and avoidance does not work. Desensitization is when a patient is given ongoing shots until the body becomes less sensitive to the allergen. Make sure you follow up with your health care provider if problems continue. HOME CARE INSTRUCTIONS It is not possible to completely avoid allergens, but you can reduce your symptoms by taking steps to limit your exposure to them. It helps to know exactly what you are allergic to so that you can avoid your specific triggers. SEEK MEDICAL CARE IF:  You have a fever.  You develop a cough that does not stop easily (persistent).  You have shortness of breath.  You start wheezing.  Symptoms interfere with normal daily activities.   This information is not intended to replace advice given to you by your health care provider. Make sure you discuss any questions you have with your health care provider.   Document Released: 06/23/2001 Document Revised: 10/19/2014 Document Reviewed: 06/05/2013 Elsevier Interactive Patient Education [employer].

## 2016-05-18 ENCOUNTER — Ambulatory Visit (INDEPENDENT_AMBULATORY_CARE_PROVIDER_SITE_OTHER): Payer: 59 | Admitting: Adult Health

## 2016-05-18 ENCOUNTER — Encounter: Payer: Self-pay | Admitting: Adult Health

## 2016-05-18 VITALS — BP 143/92 | HR 75 | Ht 71.0 in | Wt 200.0 lb

## 2016-05-18 DIAGNOSIS — R569 Unspecified convulsions: Secondary | ICD-10-CM | POA: Diagnosis not present

## 2016-05-18 DIAGNOSIS — Z5181 Encounter for therapeutic drug level monitoring: Secondary | ICD-10-CM

## 2016-05-18 NOTE — Patient Instructions (Signed)
Continue Dilantin  Blood work today If your symptoms worsen or you develop new symptoms please let us know.   

## 2016-05-18 NOTE — Progress Notes (Signed)
I have read the note, and I agree with the clinical assessment and plan.  Eric Craig KEITH   

## 2016-05-18 NOTE — Progress Notes (Signed)
PATIENT: Eric Craig DOB: December 17, 1960  REASON FOR VISIT: follow up- seizures HISTORY FROM: patient  HISTORY OF PRESENT ILLNESS: Eric Craig is a 55 year old male with a history of seizures. He returns today for follow-up. He remains on Dilantin and is tolerating it well. Denies any seizure events. Denies any changes with her gait or balance. He is able to complete all ADLs independently. He operates a Teacher, music without difficulty. He has regular dental appointments. He returns today for an evaluation.  HISTORY 05/15/15:Eric Craig is a 55 year old male with a history of seizures. He returns today for follow-up. The patient continues to take Dilantin and tolerates it well. He denies any seizure events. Denies any changes with his gait or balance. He states that his balance has been affected after an aneurysm. The patient follows up with regular dental appointments. He denies any new symptoms. The patient states that his wife recently passed away from lung cancer 2 weeks ago. He returns today for an evaluation.  HISTORY 05/07/14 ([place]): Eric Craig is a 56 y.o. male here as a follow up. He has a history of seizure disorder. Last visit was 10/2013 , at this time seizures were stable. We attempted to switch him to [place] but he did not tolerate and returned to dilantin. Has been tolerating well. No seizure episodes. Overall doing well. No gait instability. No noted tremor. Neck pain is resolved.     REVIEW OF SYSTEMS: Out of a complete 14 system review of symptoms, the patient complains only of the following symptoms, and all other reviewed systems are negative.  ALLERGIES: No Known Allergies  HOME MEDICATIONS: Outpatient Medications Prior to Visit  Medication Sig Dispense Refill  . amLODipine (NORVASC) 5 MG tablet Take 5 mg by mouth daily.    Marland Kitchen aspirin EC 81 MG EC tablet Take 1 tablet (81 mg total) by mouth daily.    . benzonatate (TESSALON PERLES) 100 MG capsule Take 1-2  capsules (100-200 mg total) by mouth 3 (three) times daily as needed for cough. 30 capsule 0  . calcium carbonate (OS-CAL) 600 MG TABS Take 600 mg by mouth daily.    Marland Kitchen CIALIS 20 MG tablet   2  . DILANTIN 100 MG ER capsule Take 3 capsules (300 mg total) by mouth daily. 270 capsule 3  . fluticasone (FLONASE) 50 MCG/ACT nasal spray Place 2 sprays into both nostrils at bedtime. 16 g 0  . ipratropium (ATROVENT) 0.06 % nasal spray Place 2 sprays into both nostrils 4 (four) times daily. 15 mL 12  . losartan (COZAAR) 50 MG tablet Take 100 mg by mouth daily.     Marland Kitchen losartan-hydrochlorothiazide (HYZAAR) 100-25 MG tablet     . Multiple Vitamin (MULTIVITAMIN) capsule Take 1 capsule by mouth daily. am    . mupirocin ointment (BACTROBAN) 2 % Apply 1 application topically 2 (two) times daily. 22 g 0  . predniSONE (DELTASONE) 50 MG tablet 1 tab po on day 1, one tab po on day 2 and 3 and 1/2 tab on day 4 and 5.Take with food. 4 tablet 0  . terbinafine (LAMISIL) 250 MG tablet Take 1 tablet (250 mg total) by mouth daily. 30 tablet 1   No facility-administered medications prior to visit.     PAST MEDICAL HISTORY: Past Medical History:  Diagnosis Date  . 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 Cervical radiculopathy    recurrent  . Headache(784.0)   .  Hemianopsia    residual right homoinmous  . High blood pressure   . Hx of migraine headaches   . Malformation    occipital arteriovenous  . Seizures ([place])    Last seizure was 2001  . 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 BRAIN SURGERY     Basilar artery aneurysm repair in 1997, with facial reconstruction in 1998  . C5-C6 laminectomy  1/89  . CERVICAL DISCECTOMY  6/89   C5-C6-7:04/1992, C4-5, w/ plate fusion for left C5 radiculopathy 12/00  . Invasive loop procedure  [place] for treatment of the AVM   .  NECK SURGERY     C4-5 repair in 1989. C6-7 repair in 1993 and 2001. Residual left arm weakness  . removal of the arteriovenous malformation  1997     . right temporal craniotomy  2/97   basilarr artery aneurysm    FAMILY HISTORY: Family History  Problem Relation Age of Onset  . Aortic aneurysm Mother 27  . Hypertension Father   . Heart disease Other     SOCIAL HISTORY: Social History   Social History  . Marital status: Single    Spouse name: N/A  . Number of children: 2  . Years of education: 12   Occupational History  . [place] History Main Topics  . Smoking status: Former Smoker    Packs/day: 1.00    Years: 40.00    Types: Cigarettes  . Smokeless tobacco: Never Used     Comment: Quit three years ago.  . Alcohol use 0.0 oz/week     Comment: Drinks 12 beers per year  . Drug use: No  . Sexual activity: Not on file   Other Topics Concern  . Not on file   Social History Narrative   Lives at home with his passed two weeks ago.  (cell phone number [phone]). Has 2 daughters that accompany him here today. Patient works full time at  [employer].   Patient is left handed   Education level is some vocational college   Caffeine consumption is 4 cups daily      PHYSICAL EXAM  Vitals:   05/18/16 0830  BP: (!) 143/92  Pulse: 75  Weight: 200 lb (90.7 kg)  Height: 5\' 11"  (1.803 m)   Body mass index is 27.89 kg/m.  Generalized: Well developed, in no acute distress   Neurological examination  Mentation: Alert oriented to time, place, history taking. Follows all commands speech and language fluent Cranial nerve II-XII: Pupils were equal round reactive to light. Extraocular movements were full, visual field were full on confrontational test. Facial sensation and strength were normal. Uvula tongue midline. Head turning and shoulder shrug  were normal and symmetric. Motor: The motor testing reveals 5 over 5 strength of all 4  extremities. Good symmetric motor tone is noted throughout.  Sensory: Sensory testing is intact to soft touch on all 4 extremities. No evidence of extinction is noted.  Coordination: Cerebellar testing reveals good finger-nose-finger and heel-to-shin bilaterally.  Gait and station: Gait is normal. Tandem gait is unsteady. Romberg is negative. No drift is seen.  Reflexes: Deep tendon reflexes are symmetric and normal bilaterally.   DIAGNOSTIC DATA (LABS, IMAGING, TESTING) - I reviewed patient records, labs, notes, testing and imaging myself where available.  Lab Results  Component Value Date   WBC 4.0  12/17/2014   HGB 15.1 12/17/2014   HCT 43.0 12/17/2014   MCV 95.8 12/17/2014   PLT 184 12/17/2014      Component Value Date/Time   NA 139 12/17/2014 1329   K 3.8 12/17/2014 1329   CL 103 12/17/2014 1329   CO2 27 12/17/2014 1329   GLUCOSE 96 12/17/2014 1329   BUN 12 12/17/2014 1329   CREATININE 0.93 12/17/2014 1329   CALCIUM 9.2 12/17/2014 1329   PROT 7.1 12/17/2014 1329   ALBUMIN 4.4 12/17/2014 1329   AST 20 12/17/2014 1329   ALT 25 12/17/2014 1329   ALKPHOS 114 12/17/2014 1329   BILITOT 0.8 12/17/2014 1329   GFRNONAA >90 12/17/2014 1329   GFRAA >90 12/17/2014 1329   Lab Results  Component Value Date   CHOL 184 12/18/2014   HDL 56 12/18/2014   LDLCALC 108 (H) 12/18/2014   TRIG 98 12/18/2014   CHOLHDL 3.3 12/18/2014   Lab Results  Component Value Date   HGBA1C 5.4 12/18/2014   No results found for: VITAMINB12 No results found for: TSH    ASSESSMENT AND PLAN 55 y.o. year old male  has a past medical history of Aneurysm of anterior cerebral artery; Cervical radiculopathy; Headache(784.0); Hemianopsia; High blood pressure; migraine headaches; Malformation; Seizures ([place]); and Stroke ([place]) (10/2011). here with:  1. Seizures  Overall the patient is doing well. He will continue on Dilantin. I'll check blood work today- this will not be a trough level. Patient advised  that he has any seizure events he should let us know. Will follow-up in one year with Dr. Jannifer Franklin.    Ward Givens, MSN, NP-C 05/18/2016, 8:06 AM [facility] [address], [place], [place] [address] [phone]

## 2016-05-19 ENCOUNTER — Telehealth: Payer: Self-pay | Admitting: *Deleted

## 2016-05-19 LAB — COMPREHENSIVE METABOLIC PANEL
ALT: 21 IU/L (ref 0–44)
AST: 18 IU/L (ref 0–40)
Albumin/Globulin Ratio: 1.6 (ref 1.2–2.2)
Albumin: 4.6 g/dL (ref 3.5–5.5)
Alkaline Phosphatase: 124 IU/L — ABNORMAL HIGH (ref 39–117)
BUN/Creatinine Ratio: 16 (ref 9–20)
BUN: 17 mg/dL (ref 6–24)
Bilirubin Total: 0.4 mg/dL (ref 0.0–1.2)
CALCIUM: 9.6 mg/dL (ref 8.7–10.2)
CO2: 26 mmol/L (ref 18–29)
CREATININE: 1.05 mg/dL (ref 0.76–1.27)
Chloride: 99 mmol/L (ref 96–106)
GFR calc Af Amer: 91 mL/min/{1.73_m2} (ref >59)
GFR calc non Af Amer: 78 mL/min/{1.73_m2} (ref >59)
GLUCOSE: 95 mg/dL (ref 65–99)
Globulin, Total: 2.8 g/dL (ref 1.5–4.5)
Potassium: 4.8 mmol/L (ref 3.5–5.2)
Sodium: 142 mmol/L (ref 134–144)
Total Protein: 7.4 g/dL (ref 6.0–8.5)

## 2016-05-19 LAB — CBC WITH DIFFERENTIAL/PLATELET
BASOS: 0 %
Basophils Absolute: 0 10*3/uL (ref 0.0–0.2)
EOS (ABSOLUTE): 0.1 10*3/uL (ref 0.0–0.4)
EOS: 1 %
HEMATOCRIT: 44.3 % (ref 37.5–51.0)
Hemoglobin: 15.1 g/dL (ref 12.6–17.7)
IMMATURE GRANS (ABS): 0 10*3/uL (ref 0.0–0.1)
Immature Granulocytes: 0 %
LYMPHS: 30 %
Lymphocytes Absolute: 1.1 10*3/uL (ref 0.7–3.1)
MCH: 33.4 pg — ABNORMAL HIGH (ref 26.6–33.0)
MCHC: 34.1 g/dL (ref 31.5–35.7)
MCV: 98 fL — AB (ref 79–97)
MONOS ABS: 0.4 10*3/uL (ref 0.1–0.9)
Monocytes: 10 %
Neutrophils Absolute: 2.1 10*3/uL (ref 1.4–7.0)
Neutrophils: 59 %
PLATELETS: 185 10*3/uL (ref 150–379)
RBC: 4.52 x10E6/uL (ref 4.14–5.80)
RDW: 14.4 % (ref 12.3–15.4)
WBC: 3.7 10*3/uL (ref 3.4–10.8)

## 2016-05-19 LAB — PHENYTOIN LEVEL, TOTAL: Phenytoin (Dilantin), Serum: 13.7 ug/mL (ref 10.0–20.0)

## 2016-05-19 NOTE — Telephone Encounter (Signed)
Per Edman Circle, NP, spoke with patient and informed him his lab results are unremarkable. He verbalized understanding, appreciation.

## 2016-06-07 ENCOUNTER — Ambulatory Visit (INDEPENDENT_AMBULATORY_CARE_PROVIDER_SITE_OTHER): Payer: 59

## 2016-06-07 ENCOUNTER — Ambulatory Visit (HOSPITAL_COMMUNITY)
Admission: EM | Admit: 2016-06-07 | Discharge: 2016-06-07 | Disposition: A | Discharge status: Home / Self Care | Payer: 59 | Attending: Nurse Practitioner | Admitting: Nurse Practitioner

## 2016-06-07 ENCOUNTER — Encounter (HOSPITAL_COMMUNITY): Payer: Self-pay | Admitting: Emergency Medicine

## 2016-06-07 DIAGNOSIS — R05 Cough: Secondary | ICD-10-CM

## 2016-06-07 DIAGNOSIS — J329 Chronic sinusitis, unspecified: Secondary | ICD-10-CM

## 2016-06-07 DIAGNOSIS — R059 Cough, unspecified: Secondary | ICD-10-CM

## 2016-06-07 MED ORDER — HYDROCOD POLST-CPM POLST ER 10-8 MG/5ML PO SUER: 5.0000 mL | Freq: Two times a day (BID) | ORAL | 0 refills | Status: AC | PRN

## 2016-06-07 MED ORDER — AMOXICILLIN-POT CLAVULANATE 875-125 MG PO TABS: 1.0000 | ORAL_TABLET | Freq: Two times a day (BID) | ORAL | 0 refills | Status: AC

## 2016-06-07 NOTE — ED Triage Notes (Signed)
The patient presented to the [facility] with a complaint of a cough and chest congestion x 1 month. The patient stated that he was taking some of his wife's amoxicillin for 2 days with no results.

## 2016-06-07 NOTE — ED Provider Notes (Signed)
CSN: [id number]     Arrival date & time 06/07/16  1746 History   First MD Initiated Contact with Patient 06/07/16 2000     Chief Complaint  Patient presents with  . Cough   (Consider location/radiation/quality/duration/timing/severity/associated sxs/prior Treatment) Eric Craig is a 55 y.o male, presents today complaining of head cold, chest cold, and productive cough for 1 month. Wife states that the symptoms are getting worst. He also endorses running nose, sore throat, some sinus pressure, and some shortness of breath. Patient have tried OTC cold medications with no improvement. Wife is requesting for chest xray.       Past Medical History:  Diagnosis Date  . 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 Cervical radiculopathy    recurrent  . Headache(784.0)   . Hemianopsia    residual right homoinmous  . High blood pressure   . Hx of migraine headaches   . Malformation    occipital arteriovenous  . Seizures ([place])    Last seizure was 2001  . Stroke [facility]) 10/2011   brainstem, not seen on MRI   Past Surgical History:  Procedure Laterality Date  . BRAIN SURGERY     Basilar artery aneurysm repair in 1997, with facial reconstruction in 1998  . C5-C6 laminectomy  1/89  . CERVICAL DISCECTOMY  6/89   C5-C6-7:04/1992, C4-5, w/ plate fusion for left C5 radiculopathy 12/00  . Invasive loop procedure  [place] for treatment of the AVM   . NECK SURGERY     C4-5 repair in 1989. C6-7 repair in 1993 and 2001. Residual left arm weakness  . removal of the arteriovenous malformation  1997   [place]  . right temporal craniotomy  2/97   basilarr artery aneurysm   Family History  Problem Relation Age of Onset  . Aortic aneurysm Mother 19  . Hypertension Father   . Heart disease Other    Social History  Substance Use Topics  . Smoking status: Former Smoker    Packs/day: 1.00    Years: 40.00    Types: Cigarettes  . Smokeless  tobacco: Never Used     Comment: Quit three years ago.  . Alcohol use 0.0 oz/week     Comment: Drinks 12 beers per year    Review of Systems  Constitutional: Negative for fatigue.  HENT: Positive for congestion, sinus pressure and sneezing.   Eyes: Negative for pain and redness.  Respiratory:       Little shortness of breath, cough is productive  Cardiovascular: Negative for palpitations.  Gastrointestinal: Positive for abdominal pain. Negative for diarrhea, nausea and vomiting.       Sore at the abdomen from coughing     Allergies  Review of patient's allergies indicates no known allergies.  Home Medications   Prior to Admission medications   Medication Sig Start Date End Date Taking? Authorizing Provider  amLODipine (NORVASC) 5 MG tablet Take 5 mg by mouth daily.    Historical Provider, MD  amoxicillin-clavulanate (AUGMENTIN) 875-125 MG tablet Take 1 tablet by mouth every 12 (twelve) hours. 06/07/16 06/14/16  Barry Dienes, NP  aspirin EC 81 MG EC tablet Take 1 tablet (81 mg total) by mouth daily. 12/19/14   Barton Dubois, MD  calcium carbonate (OS-CAL) 600 MG TABS Take 600 mg by mouth daily.    Historical Provider, MD  chlorpheniramine-HYDROcodone (TUSSIONEX PENNKINETIC ER) 10-8 MG/5ML SUER Take 5 mLs by mouth every 12 (twelve)  hours as needed for cough. 06/07/16 06/14/16  Barry Dienes, NP  CIALIS 20 MG tablet  10/26/15   Historical Provider, MD  DILANTIN 100 MG ER capsule Take 3 capsules (300 mg total) by mouth daily. 05/15/15   Ward Givens, NP  fluticasone (FLONASE) 50 MCG/ACT nasal spray Place 2 sprays into both nostrils at bedtime. 12/06/15   Waldemar Dickens, MD  ipratropium (ATROVENT) 0.06 % nasal spray Place 2 sprays into both nostrils 4 (four) times daily. 12/06/15   Waldemar Dickens, MD  losartan (COZAAR) 50 MG tablet Take 100 mg by mouth daily.  04/30/14   Historical Provider, MD  losartan-hydrochlorothiazide Konrad Penta) 100-25 MG tablet  12/18/15   Historical Provider, MD  Multiple Vitamin  (MULTIVITAMIN) capsule Take 1 capsule by mouth daily. am    Historical Provider, MD  terbinafine (LAMISIL) 250 MG tablet Take 1 tablet (250 mg total) by mouth daily. 01/30/16   Landis Martins, DPM   Meds Ordered and Administered this Visit  Medications - No data to display  BP (!) 156/107 (BP Location: Right Arm)   Pulse 78   Temp 98.1 F (36.7 C) (Oral)   Resp 16   SpO2 98%  No data found.   Physical Exam  Constitutional: He is oriented to person, place, and time. He appears well-developed and well-nourished. No distress.  HENT:  Head: Normocephalic and atraumatic.  Right Ear: External ear normal.  Left Ear: External ear normal.  Nose: Nose normal.  Mouth/Throat: No oropharyngeal exudate.  TM pearly gray bilaterally with no erythema. No sinus pain on percussion of frontal or maxillary sinuses  Eyes: Conjunctivae are normal. Pupils are equal, round, and reactive to light.  Neck: Normal range of motion. Neck supple.  Cardiovascular: Normal rate, regular rhythm and normal heart sounds.   No murmur heard. Pulmonary/Chest: Effort normal and breath sounds normal. He has no wheezes. He exhibits no tenderness.  Abdominal: Soft. Bowel sounds are normal. There is no tenderness.  Lymphadenopathy:    He has no cervical adenopathy.  Neurological: He is alert and oriented to person, place, and time.  Skin: Skin is warm and dry.  Psychiatric: He has a normal mood and affect.  Nursing note and vitals reviewed.   Urgent Care Course   Clinical Course    Procedures (including critical care time)  Labs Review Labs Reviewed - No data to display  Imaging Review Dg Chest 2 View  Result Date: 06/07/2016 CLINICAL DATA:  Cough for 1 month. EXAM: CHEST  2 VIEW COMPARISON:  07/27/2014 FINDINGS: Two views study shows no focal airspace consolidation. No pulmonary edema or pleural effusion. Asymmetric elevation left hemidiaphragm is stable as is the left basilar scarring. The cardiopericardial  silhouette is within normal limits for size. Lateral view shows a 2.4 cm nodular density superimposed on the spine, not definitely seen previously. IMPRESSION: 1. No evidence for focal airspace consolidation. 2. 2.4 cm nodular density superimposed on the spine seen on lateral film. This may be related to paravertebral spurring, but lung nodule could also have this appearance. CT chest without contrast recommended to further evaluate. Electronically Signed   By: Misty Stanley M.D.   On: 06/07/2016 20:48       MDM   1. Cough   2. Sinusitis, unspecified chronicity, unspecified location    Chest xray was negative for pneumonia. There is a 2.4cam nodular density superimposed on the spine seem on lateral film. This may be related to paravetebral spurring, but lung nodule could also  have this appearance and CT chest without contrast is recommended. Patient informed of this finding. Instructed to follow up with his primary care doctor for obtain an order for the CT of Chest.   Patient's symptoms have been present for 1 month and is not improving. Prescription given for Augmentin 875-125 mg BID x 7 days given. Informed to follow up with his PCP if he continues to not improve.    Barry Dienes, NP 06/07/16 2130

## 2016-06-25 ENCOUNTER — Other Ambulatory Visit: Payer: Self-pay | Admitting: Adult Health

## 2017-04-09 ENCOUNTER — Ambulatory Visit (INDEPENDENT_AMBULATORY_CARE_PROVIDER_SITE_OTHER): Payer: 59 | Admitting: Podiatry

## 2017-04-09 ENCOUNTER — Encounter: Payer: Self-pay | Admitting: Podiatry

## 2017-04-09 ENCOUNTER — Ambulatory Visit (INDEPENDENT_AMBULATORY_CARE_PROVIDER_SITE_OTHER): Payer: 59

## 2017-04-09 DIAGNOSIS — M722 Plantar fascial fibromatosis: Secondary | ICD-10-CM

## 2017-04-09 MED ORDER — TRIAMCINOLONE ACETONIDE 10 MG/ML IJ SUSP
10.0000 mg | Freq: Once | INTRAMUSCULAR | Status: AC
  Administered 2017-04-09: 10 mg

## 2017-04-09 NOTE — Patient Instructions (Signed)

## 2017-04-09 NOTE — Progress Notes (Signed)
Subjective:    Patient ID: Eric Craig, male   DOB: 56 y.o.   MRN: [id number]   HPI patient presents stating he's develop a lot of pain in the bottom of his left heel times last month and states it's worse when he gets up and after walking on it for a while    ROS      Objective:  Physical Exam neurovascular status intact negative Homans sign noted with patient found to have exquisite discomfort left plantar fashion at the insertional point tendon into the calcaneus     Assessment:   Acute plantar fasciitis left      Plan:    H&P x-rays reviewed and injected the plantar fascial left 3 mg Kenalog 5 mill grams Xylocaine and applied fascial brace with instructions on usage. Gave instructions on physical therapy and utilize oral anti-inflammatories and reappoint to recheck again in the next week  X-rays indicate small spur with slight posterior spur formation left

## 2017-04-21 ENCOUNTER — Encounter: Payer: Self-pay | Admitting: Podiatry

## 2017-04-21 ENCOUNTER — Ambulatory Visit (INDEPENDENT_AMBULATORY_CARE_PROVIDER_SITE_OTHER): Payer: 59 | Admitting: Podiatry

## 2017-04-21 DIAGNOSIS — M722 Plantar fascial fibromatosis: Secondary | ICD-10-CM

## 2017-04-21 NOTE — Progress Notes (Signed)
Subjective:    Patient ID: Eric Craig, male   DOB: 56 y.o.   MRN: [id number]   HPI patient presents stating the heel is improved quite a bit    ROS      Objective:  Physical Exam Neurovascular status intact with pain in the left heel which is improved by about 70% with mild discomfort still noted    Assessment:   Plantar fasciitis left improved with pain still noted upon deep palpation      Plan:   Today I recommended physical therapy supportive shoes and we discussed possible long-term orthotics. Patient be seen back on an as-needed basis and was given education today

## 2017-05-13 IMAGING — DX DG CHEST 2V
2 series · 2 of 2 positions shown · non-contrast
Comparison: 07/27/2014

CLINICAL DATA: Cough for 1 month.

EXAM:
CHEST  2 VIEW

[chest pa]
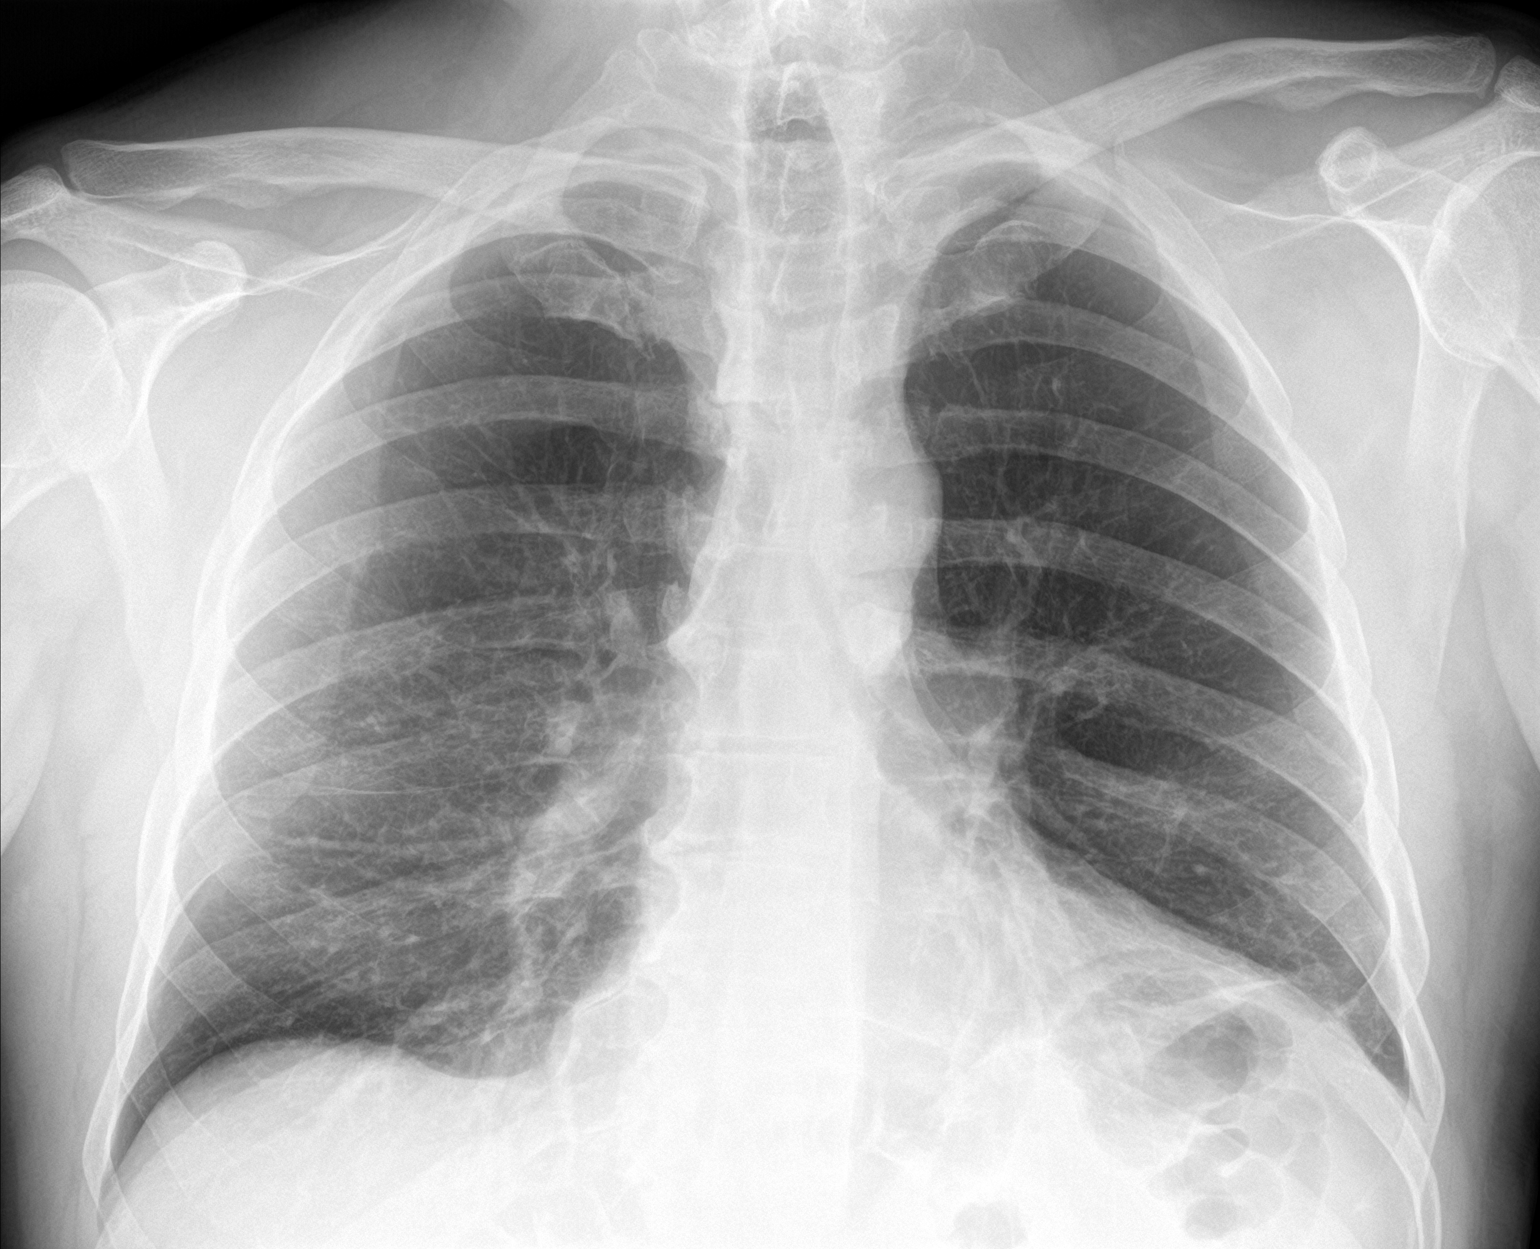

[chest lat]
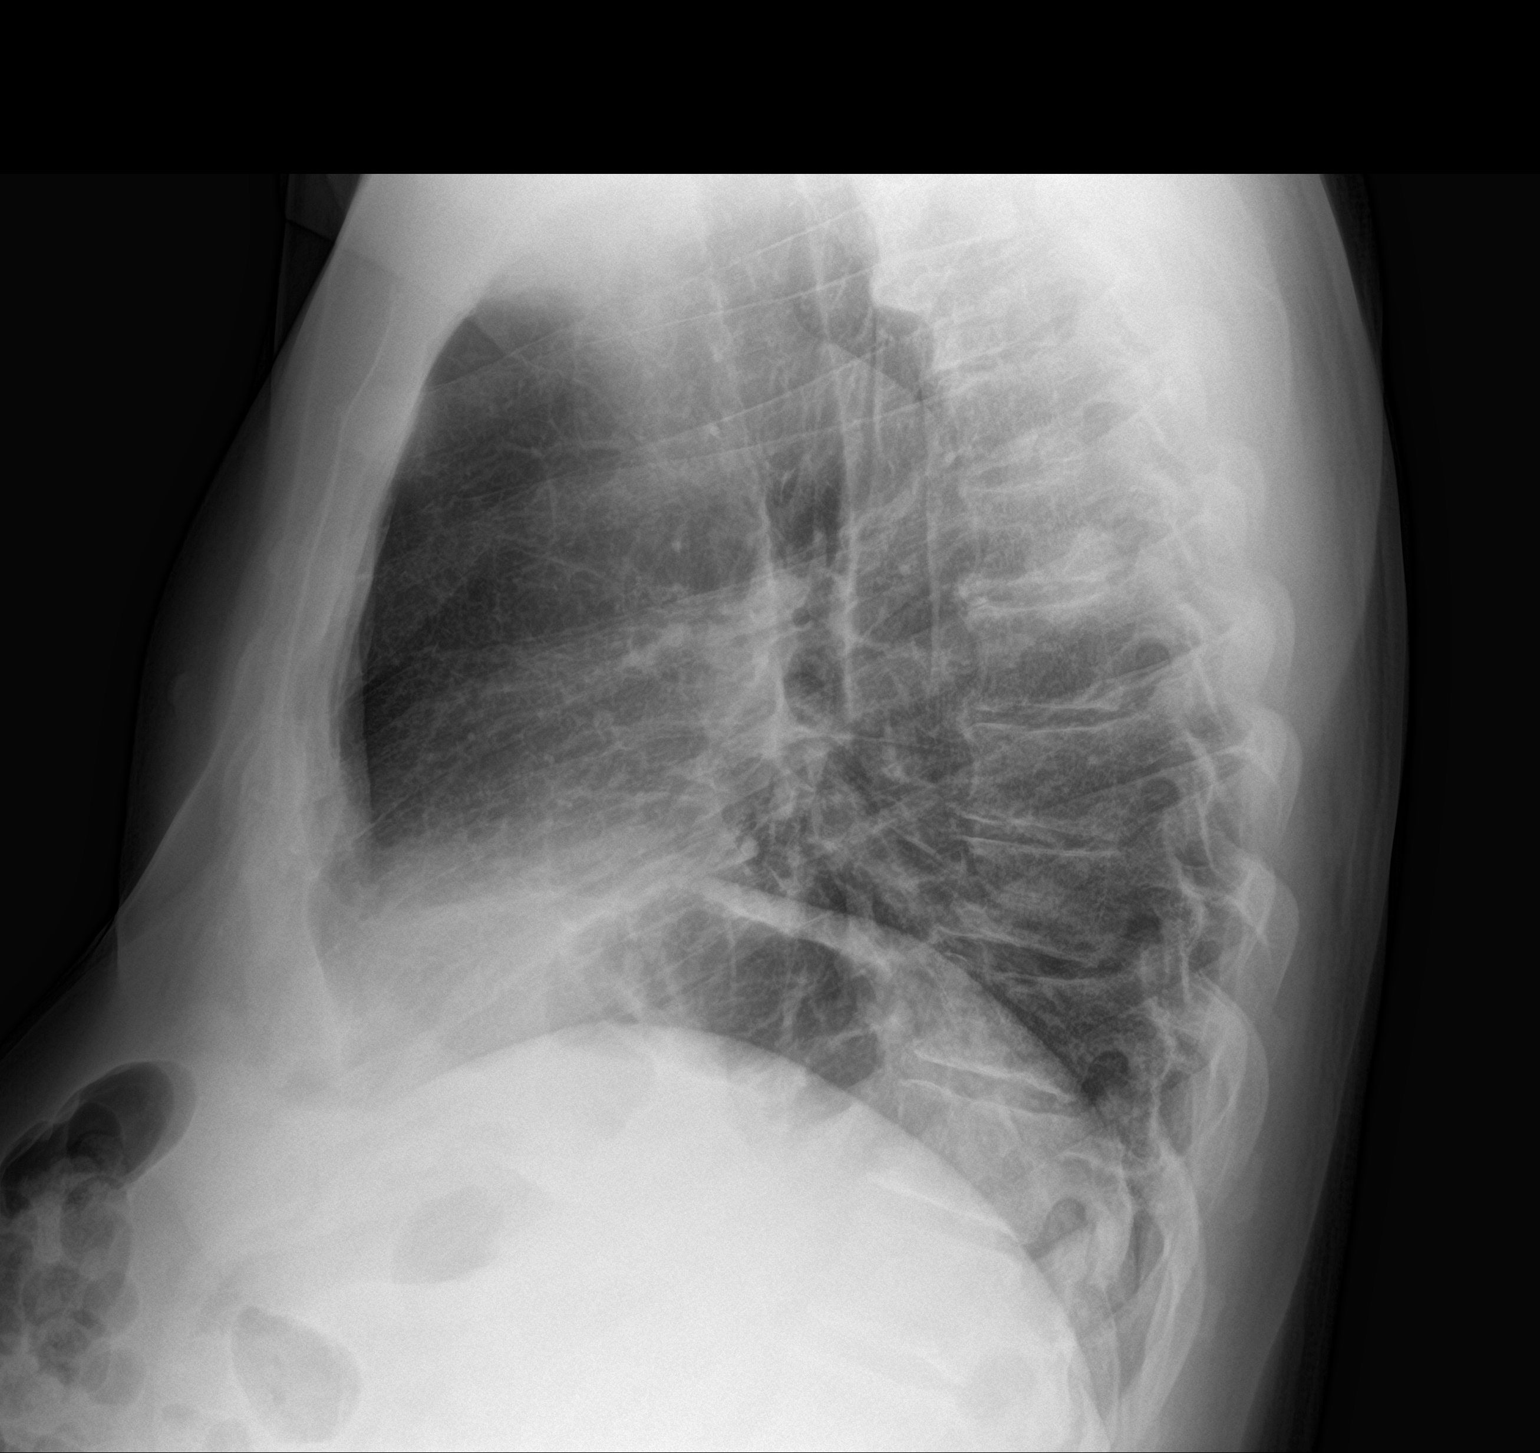

[2 of 2 positions shown; findings below may reference images not displayed]

FINDINGS: Two views study shows no focal airspace consolidation. No pulmonary
edema or pleural effusion. Asymmetric elevation left hemidiaphragm
is stable as is the left basilar scarring. The cardiopericardial
silhouette is within normal limits for size.

Lateral view shows a 2.4 cm nodular density superimposed on the
spine, not definitely seen previously.
IMPRESSION: 1. No evidence for focal airspace consolidation.
2. 2.4 cm nodular density superimposed on the spine seen on lateral
film. This may be related to paravertebral spurring, but lung nodule
could also have this appearance. CT chest without contrast
recommended to further evaluate.

## 2017-05-24 ENCOUNTER — Ambulatory Visit (INDEPENDENT_AMBULATORY_CARE_PROVIDER_SITE_OTHER): Payer: 59 | Admitting: Neurology

## 2017-05-24 ENCOUNTER — Encounter: Payer: Self-pay | Admitting: Neurology

## 2017-05-24 VITALS — BP 150/90 | HR 89 | Ht 71.0 in | Wt 205.0 lb

## 2017-05-24 DIAGNOSIS — H53461 Homonymous bilateral field defects, right side: Secondary | ICD-10-CM | POA: Diagnosis not present

## 2017-05-24 DIAGNOSIS — G40909 Epilepsy, unspecified, not intractable, without status epilepticus: Secondary | ICD-10-CM

## 2017-05-24 MED ORDER — DILANTIN 100 MG PO CAPS: 300.0000 mg | ORAL_CAPSULE | Freq: Every day | ORAL | 3 refills | Status: DC

## 2017-05-24 NOTE — Progress Notes (Signed)
Reason for visit: Seizures  Eric Craig is an 56 y.o. male  History of present illness:  Eric Craig is a 56 year old left-handed white male with a history of a left occipital AVM and a history of a basilar aneurysm, these problems were surgically corrected in the late 1990s. The patient has had residual right homonymous visual field deficit, but surprisingly he indicates that he operates a motor vehicle. He claims that his last seizure was in 2001. He claims that his visual fields have been documented through an ophthalmologist and this report has been sent to the state, but he still has a driver's license. The patient is on Dilantin, he tolerates the medication well. The patient may have an occasional headache. The patient denies any numbness or weakness of the extremities. Overall, he is functioning well. He returns for an evaluation.  Past Medical History:  Diagnosis Date  . 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 Cervical radiculopathy    recurrent  . Headache(784.0)   . Hemianopsia    residual right homoinmous  . High blood pressure   . Hx of migraine headaches   . Malformation    occipital arteriovenous  . Seizures ([place])    Last seizure was 2001  . 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 BRAIN SURGERY     Basilar artery aneurysm repair in 1997, with facial reconstruction in 1998  . C5-C6 laminectomy  1/89  . CERVICAL DISCECTOMY  6/89   C5-C6-7:04/1992, C4-5, w/ plate fusion for left C5 radiculopathy 12/00  . Invasive loop procedure  [place] for treatment of the AVM   . NECK SURGERY     C4-5 repair in 1989. C6-7 repair in 1993 and 2001. Residual left arm weakness  . removal of the arteriovenous malformation  1997   [place]  . right temporal craniotomy  2/97   basilarr artery aneurysm    Family History  Problem Relation Age of Onset  .  Aortic aneurysm Mother 36  . Hypertension Father   . Heart disease Other     Social history:  reports that he has quit smoking. His smoking use included Cigarettes. He has a 40.00 pack-year smoking history. He has never used smokeless tobacco. He reports that he drinks alcohol. He reports that he does not use drugs.   No Known Allergies  Medications:  Prior to Admission medications   Medication Sig Start Date End Date Taking? Authorizing Provider  amLODipine (NORVASC) 5 MG tablet Take 5 mg by mouth daily.   Yes [provider]  calcium carbonate (OS-CAL) 600 MG TABS Take 600 mg by mouth daily.   Yes [provider]  CIALIS 20 MG tablet  10/26/15  Yes [provider]  DILANTIN 100 MG ER capsule Take 3 capsules by mouth  daily 06/25/16  Yes Kathrynn Ducking, MD  fluticasone [facility]) 50 MCG/ACT nasal spray Place 2 sprays into both nostrils at bedtime. Patient taking differently: Place 2 sprays into both nostrils as needed.  12/06/15  Yes Waldemar Dickens, MD  losartan-hydrochlorothiazide (HYZAAR) 100-25 MG tablet Take 1 tablet by mouth daily.  12/18/15  Yes [provider]  Multiple Vitamin (MULTIVITAMIN) capsule Take 1 capsule by mouth daily. am   Yes [provider]    ROS:  Out of a complete 14 system review of  symptoms, the patient complains only of the following symptoms, and all other reviewed systems are negative.  Cough  Blood pressure (!) 150/90, pulse 89, height 5\' 11"  (1.803 m), weight 205 lb (93 kg).  Physical Exam  General: The patient is alert and cooperative at the time of the examination.  Skin: No significant peripheral edema is noted.   Neurologic Exam  Mental status: The patient is alert and oriented x 3 at the time of the examination. The patient has apparent normal recent and remote memory, with an apparently normal attention span and concentration ability.   Cranial nerves: Facial symmetry is present. Speech is  normal, no aphasia or dysarthria is noted. Extraocular movements are full. Visual fields are associated with a dense right homonymous visual field deficit.  Motor: The patient has good strength in all 4 extremities.  Sensory examination: Soft touch sensation is symmetric on the face, arms, and legs.  Coordination: The patient has good finger-nose-finger and heel-to-shin bilaterally.  Gait and station: The patient has a normal gait. Tandem gait is minimally unsteady. Romberg is negative. No drift is seen.  Reflexes: Deep tendon reflexes are symmetric.   Assessment/Plan:  1. History of seizures, well controlled  2. Dense right homonymous visual field deficit  The patient continues to operate a motor vehicle with a severe right homonymous visual field deficit. I have indicated that generally this would exclude an individual from driving. The patient is doing well with seizure control. He will remain on Dilantin. He wishes to have his blood work done with his primary care physician, he claims that Dr. Reynaldo Minium will check a Dilantin level. Blood work therefore was not done today. A prescription was sent in for his Dilantin. He will follow-up in one year.    Jill Alexanders MD 05/24/2017 8:04 AM  [facility] [address] [place], [place] [address]  Phone [phone] Fax [phone]

## 2017-09-01 ENCOUNTER — Ambulatory Visit: Payer: 59 | Admitting: Podiatry

## 2017-09-01 ENCOUNTER — Encounter: Payer: Self-pay | Admitting: Podiatry

## 2017-09-01 DIAGNOSIS — M722 Plantar fascial fibromatosis: Secondary | ICD-10-CM

## 2017-09-01 NOTE — Progress Notes (Signed)
Subjective:    Patient ID: Eric Craig, male   DOB: 56 y.o.   MRN: [id number]   HPI patient states that his heel is still really bothering him and he only got 1 month of relief with medication    ROS      Objective:  Physical Exam neurovascular status intact with exquisite discomfort plantar aspect left heel at the insertional point tendon into the calcaneus medial side with mild discomfort lateral but not to the same degree     Assessment:  Acute plantar fasciitis left medial side with pain       Plan:  H&P condition reviewed and at this point I went ahead and I advised this patient on different treatment modalities available and patient wants shockwave therapy. I educated him on this and I dispensed air fracture walker for the postoperative period and instructions on usage and also ice therapy. Reappoint for shockwave with Janett Billow next week

## 2017-09-01 NOTE — Patient Instructions (Signed)

## 2017-09-07 ENCOUNTER — Ambulatory Visit (INDEPENDENT_AMBULATORY_CARE_PROVIDER_SITE_OTHER): Payer: 59 | Admitting: Podiatry

## 2017-09-07 DIAGNOSIS — M722 Plantar fascial fibromatosis: Secondary | ICD-10-CM

## 2017-09-14 ENCOUNTER — Ambulatory Visit (INDEPENDENT_AMBULATORY_CARE_PROVIDER_SITE_OTHER): Payer: 59 | Admitting: Podiatry

## 2017-09-14 DIAGNOSIS — M722 Plantar fascial fibromatosis: Secondary | ICD-10-CM

## 2017-09-24 ENCOUNTER — Ambulatory Visit (INDEPENDENT_AMBULATORY_CARE_PROVIDER_SITE_OTHER): Payer: 59 | Admitting: Sports Medicine

## 2017-09-24 DIAGNOSIS — M722 Plantar fascial fibromatosis: Secondary | ICD-10-CM

## 2017-09-27 NOTE — Progress Notes (Signed)
Patient presents with ongoing chronic pain in his Lt heel. He has tried various treatments with not much relief.    Pain on palpation to left medial heel band   ESWT therapy administered to left heel band for 5 joules and tolerated well. EPAT therapy done to surrounding tissues. Advised against NSAIDs and ice. He is to follow up next week for 2nd treatment

## 2017-09-27 NOTE — Progress Notes (Signed)
Patient presents with ongoing chronic pain in his Lt heel. He has tried various treatments with not much relief.    Pain on palpation to left medial heel band   ESWT therapy administered to left heel band for 8 joules and tolerated well. EPAT therapy done to surrounding tissues. Advised against NSAIDs and ice. He is to follow up next week for 3rd treatment

## 2017-10-19 NOTE — Progress Notes (Signed)
Patient presents with ongoing chronic pain in his Lt heel. He has tried various treatments with not much relief.    Pain on palpation to left medial heel band   ESWT therapy administered to left heel band for 9 joules and tolerated well. EPAT therapy done to surrounding tissues. Advised against NSAIDs and ice. He is to follow up in 4 weeks for his 4th treatment

## 2017-10-19 NOTE — Progress Notes (Signed)
Agree with nurse note -Dr. Cannon Kettle

## 2017-10-29 ENCOUNTER — Ambulatory Visit: Payer: 59

## 2018-05-24 ENCOUNTER — Ambulatory Visit: Payer: 59 | Admitting: Neurology

## 2018-05-24 ENCOUNTER — Encounter: Payer: Self-pay | Admitting: Neurology

## 2018-05-24 VITALS — BP 132/94 | HR 72 | Ht 71.0 in | Wt 195.0 lb

## 2018-05-24 DIAGNOSIS — G40909 Epilepsy, unspecified, not intractable, without status epilepticus: Secondary | ICD-10-CM | POA: Diagnosis not present

## 2018-05-24 DIAGNOSIS — H53461 Homonymous bilateral field defects, right side: Secondary | ICD-10-CM

## 2018-05-24 MED ORDER — DILANTIN 100 MG PO CAPS: 300.0000 mg | ORAL_CAPSULE | Freq: Every day | ORAL | 3 refills | Status: DC

## 2018-05-24 NOTE — Progress Notes (Signed)
Reason for visit: Seizures  Eric Craig is an 57 y.o. male  History of present illness:  Mr. Otterness is a 57 year old left-handed white male with a history of an occipital AVM and basal artery aneurysm resection, he has a residual right homonymous visual field deficit, his last seizure was in 2001.  The patient is on Dilantin, he tolerates the drug well.  He does report some mild gait instability, he has not had any falls.  Even with his dense right homonymous visual field deficit he claims that he is able to operate a motor vehicle.  He has not had any issues in this regard.  The patient is going for cataract surgery in the near future, he follows up through this office for his seizures.  His primary care physician checks blood work annually that includes a Dilantin level.  Past Medical History:  Diagnosis Date  . 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 Cervical radiculopathy    recurrent  . Headache(784.0)   . Hemianopsia    residual right homoinmous  . High blood pressure   . Hx of migraine headaches   . Malformation    occipital arteriovenous  . Seizures ([place])    Last seizure was 2001  . 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 BRAIN SURGERY     Basilar artery aneurysm repair in 1997, with facial reconstruction in 1998  . C5-C6 laminectomy  1/89  . CERVICAL DISCECTOMY  6/89   C5-C6-7:04/1992, C4-5, w/ plate fusion for left C5 radiculopathy 12/00  . Invasive loop procedure  [place] for treatment of the AVM   . NECK SURGERY     C4-5 repair in 1989. C6-7 repair in 1993 and 2001. Residual left arm weakness  . removal of the arteriovenous malformation  1997   [place]  . right temporal craniotomy  2/97   basilarr artery aneurysm    Family History  Problem Relation Age of Onset  . Aortic aneurysm Mother 66  . Hypertension Father   . Heart  disease Other     Social history:  reports that he has quit smoking. His smoking use included cigarettes. He has a 40.00 pack-year smoking history. He has never used smokeless tobacco. He reports that he drinks alcohol. He reports that he does not use drugs.   No Known Allergies  Medications:  Prior to Admission medications   Medication Sig Start Date End Date Taking? Authorizing Provider  amLODipine (NORVASC) 5 MG tablet Take 5 mg by mouth daily.   Yes [provider]  calcium carbonate (OS-CAL) 600 MG TABS Take 600 mg by mouth daily.   Yes [provider]  CIALIS 20 MG tablet  10/26/15  Yes [provider]  DILANTIN 100 MG ER capsule Take 3 capsules (300 mg total) by mouth daily. 05/24/17  Yes Kathrynn Ducking, MD  fluticasone [facility]) 50 MCG/ACT nasal spray Place 2 sprays into both nostrils at bedtime. Patient taking differently: Place 2 sprays into both nostrils as needed.  12/06/15  Yes Waldemar Dickens, MD  losartan-hydrochlorothiazide (HYZAAR) 100-25 MG tablet Take 1 tablet by mouth daily.  12/18/15  Yes [provider]  Multiple Vitamin (MULTIVITAMIN) capsule Take 1 capsule by mouth daily. am   Yes [provider]    ROS:  Out of a complete 14 system  review of symptoms, the patient complains only of the following symptoms, and all other reviewed systems are negative.  Blurred vision  Blood pressure (!) 132/94, pulse 72, height 5\' 11"  (1.803 m), weight 195 lb (88.5 kg).  Physical Exam  General: The patient is alert and cooperative at the time of the examination.  Skin: No significant peripheral edema is noted.   Neurologic Exam  Mental status: The patient is alert and oriented x 3 at the time of the examination. The patient has apparent normal recent and remote memory, with an apparently normal attention span and concentration ability.   Cranial nerves: Facial symmetry is present. Speech is normal, no aphasia or dysarthria is  noted. Extraocular movements are full. Visual fields are full.  Motor: The patient has good strength in all 4 extremities.  Sensory examination: Soft touch sensation is symmetric on the face, arms, and legs.  Coordination: The patient has good finger-nose-finger and heel-to-shin bilaterally.  Gait and station: The patient has a normal gait. Tandem gait is unsteady. Romberg is negative. No drift is seen.  Reflexes: Deep tendon reflexes are symmetric.   Assessment/Plan:  1.  History of seizures  2.  Right homonymous visual field deficit  The patient is doing well at this time, a prescription for Dilantin was sent in, he will follow-up in 1 year.  Jill Alexanders MD 05/24/2018 7:47 AM  [facility] [address] [place], [place] [address]  Phone [phone] Fax [phone]

## 2018-05-25 ENCOUNTER — Ambulatory Visit: Payer: 59 | Admitting: Neurology

## 2018-06-12 HISTORY — PX: OTHER SURGICAL HISTORY: SHX169

## 2019-02-27 ENCOUNTER — Telehealth: Payer: Self-pay

## 2019-02-27 DIAGNOSIS — Z0289 Encounter for other administrative examinations: Secondary | ICD-10-CM

## 2019-02-27 NOTE — Telephone Encounter (Signed)
DMV paper work has been completed and signed by Dr. Jannifer Franklin. Forms fwd back to medical records for processing.

## 2019-03-08 ENCOUNTER — Telehealth: Payer: Self-pay | Admitting: *Deleted

## 2019-03-08 NOTE — Telephone Encounter (Signed)
I re faxed dmv paperwork on 03/08/19

## 2019-05-29 ENCOUNTER — Encounter: Payer: Self-pay | Admitting: Adult Health

## 2019-05-29 ENCOUNTER — Ambulatory Visit: Payer: 59 | Admitting: Adult Health

## 2019-05-29 ENCOUNTER — Other Ambulatory Visit: Payer: Self-pay

## 2019-05-29 VITALS — BP 126/84 | HR 77 | Temp 98.2°F | Ht 71.0 in | Wt 197.0 lb

## 2019-05-29 DIAGNOSIS — G40909 Epilepsy, unspecified, not intractable, without status epilepticus: Secondary | ICD-10-CM

## 2019-05-29 DIAGNOSIS — H53461 Homonymous bilateral field defects, right side: Secondary | ICD-10-CM | POA: Diagnosis not present

## 2019-05-29 MED ORDER — DILANTIN 100 MG PO CAPS: 300.0000 mg | ORAL_CAPSULE | Freq: Every day | ORAL | 3 refills | Status: DC

## 2019-05-29 NOTE — Progress Notes (Signed)
PATIENT: Eric Craig DOB: [birth year]  REASON FOR VISIT: follow up HISTORY FROM: patient  HISTORY OF PRESENT ILLNESS: Today 05/29/19:  [name] is a 58 year old male with a history of seizures.  He returns today for follow-up.  He remains on Dilantin and tolerates it well.  He denies any seizure events.  Denies any significant changes with his gait or balance.  No falls.  He does have a right homonymous visual field deficit.  He states he is also having right hip pain.  He states this been going on 1520 years.  He has not discussed with his primary care.  He returns today for an evaluation.  HISTORYMr. Eric Craig is a 58 year old left-handed white male with a history of an occipital AVM and basal artery aneurysm resection, he has a residual right homonymous visual field deficit, his last seizure was in 2001.  The patient is on Dilantin, he tolerates the drug well.  He does report some mild gait instability, he has not had any falls.  Even with his dense right homonymous visual field deficit he claims that he is able to operate a motor vehicle.  He has not had any issues in this regard.  The patient is going for cataract surgery in the near future, he follows up through this office for his seizures.  His primary care physician checks blood work annually that includes a Dilantin level.   REVIEW OF SYSTEMS: Out of a complete 14 system review of symptoms, the patient complains only of the following symptoms, and all other reviewed systems are negative.  See HPI  ALLERGIES: No Known Allergies  HOME MEDICATIONS: Outpatient Medications Prior to Visit  Medication Sig Dispense Refill  . amLODipine (NORVASC) 5 MG tablet Take 5 mg by mouth daily.    . calcium carbonate (OS-CAL) 600 MG TABS Take 600 mg by mouth daily.    Marland Kitchen CIALIS 20 MG tablet Take 20 mg by mouth as needed.   2  . DILANTIN 100 MG ER capsule Take 3 capsules (300 mg total) by mouth daily. 270 capsule 3  . fluticasone (FLONASE)  50 MCG/ACT nasal spray Place 2 sprays into both nostrils at bedtime. (Patient taking differently: Place 2 sprays into both nostrils as needed. ) 16 g 0  . Multiple Vitamin (MULTIVITAMIN) capsule Take 1 capsule by mouth daily. am    . olmesartan-hydrochlorothiazide (BENICAR HCT) 40-25 MG tablet Take 1 tablet by mouth daily.    Marland Kitchen losartan-hydrochlorothiazide (HYZAAR) 100-25 MG tablet Take 1 tablet by mouth daily.      No facility-administered medications prior to visit.     PAST MEDICAL HISTORY: Past Medical History:  Diagnosis Date  . 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 Cervical radiculopathy    recurrent  . Headache(784.0)   . Hemianopsia    residual right homoinmous  . High blood pressure   . Hx of migraine headaches   . Malformation    occipital arteriovenous  . Seizures ([place])    Last seizure was 2001  . Stroke ([place]) 10/2011   brainstem, not seen on MRI    PAST SURGICAL HISTORY: Past Surgical History:  Procedure Laterality Date  . BRAIN SURGERY     Basilar artery aneurysm repair in 1997, with facial reconstruction in 1998  . C5-C6 laminectomy  1/89  . cataract Bilateral 06/2018  . CERVICAL DISCECTOMY  6/89   C5-C6-7:04/1992, C4-5, w/ plate fusion for left C5 radiculopathy 12/00  .  Invasive loop procedure  [place] for treatment of the AVM   . NECK SURGERY     C4-5 repair in 1989. C6-7 repair in 1993 and 2001. Residual left arm weakness  . removal of the arteriovenous malformation  1997   [place]  . right temporal craniotomy  2/97   basilarr artery aneurysm    FAMILY HISTORY: Family History  Problem Relation Age of Onset  . Aortic aneurysm Mother 6  . Hypertension Father   . Heart disease Other     SOCIAL HISTORY: Social History   Socioeconomic History  . Marital status: Single    Spouse name: Not on file  . Number of children: 2  . Years of education: 63  . Highest education level: Not on file   Occupational History  . Occupation: Programmer, systems: [employer]  . Financial resource strain: Not on file  . Food insecurity    Worry: Not on file    Inability: Not on file  . Transportation needs    Medical: Not on file    Non-medical: Not on file  Tobacco Use  . Smoking status: Former Smoker    Packs/day: 1.00    Years: 40.00    Pack years: 40.00    Types: Cigarettes  . Smokeless tobacco: Never Used  . Tobacco comment: Quit three years ago.  Substance and Sexual Activity  . Alcohol use: Yes    Alcohol/week: 0.0 standard drinks    Comment: Drinks 12 beers per year  . Drug use: No  . Sexual activity: Not on file  Lifestyle  . Physical activity    Days per week: Not on file    Minutes per session: Not on file  . Stress: Not on file  Relationships  . Social Herbalist on phone: Not on file    Gets together: Not on file    Attends religious service: Not on file    Active member of club or organization: Not on file    Attends meetings of clubs or organizations: Not on file    Relationship status: Not on file  . Intimate partner violence    Fear of current or ex partner: Not on file    Emotionally abused: Not on file    Physically abused: Not on file    Forced sexual activity: Not on file  Other Topics Concern  . Not on file  Social History Narrative   Lives at home with his passed two weeks ago.  (cell phone number [phone]). Has 2 daughters that accompany him here today. Patient works full time at  [employer].   Patient is left handed   Education level is some vocational college   Caffeine consumption is 4 cups daily      PHYSICAL EXAM  Vitals:   05/29/19 0743  BP: 126/84  Pulse: 77  Temp: 98.2 F (36.8 C)  TempSrc: Oral  Weight: 197 lb (89.4 kg)  Height: 5\' 11"  (1.803 m)   Body mass index is 27.48 kg/m.  Generalized: Well developed, in no acute distress   Neurological examination  Mentation: Alert  oriented to time, place, history taking. Follows all commands speech and language fluent Cranial nerve II-XII:. Extraocular movements were full.right homonymous visual field deficit.  Facial sensation and strength were normal. Uvula tongue midline. Head turning and shoulder shrug  were normal and symmetric. Motor: The motor testing reveals 5 over 5 strength of all  4 extremities. Good symmetric motor tone is noted throughout.  Sensory: Sensory testing is intact to soft touch on all 4 extremities. No evidence of extinction is noted.  Coordination: Cerebellar testing reveals good finger-nose-finger and heel-to-shin bilaterally.  Gait and station: Gait is normal. Tandem gait not attempted.. Romberg is negative. No drift is seen.  Reflexes: Deep tendon reflexes are symmetric and normal bilaterally.   DIAGNOSTIC DATA (LABS, IMAGING, TESTING) - I reviewed patient records, labs, notes, testing and imaging myself where available.  Lab Results  Component Value Date   WBC 3.7 05/18/2016   HGB 15.1 05/18/2016   HCT 44.3 05/18/2016   MCV 98 (H) 05/18/2016   PLT 185 05/18/2016      Component Value Date/Time   NA 142 05/18/2016 0856   K 4.8 05/18/2016 0856   CL 99 05/18/2016 0856   CO2 26 05/18/2016 0856   GLUCOSE 95 05/18/2016 0856   GLUCOSE 96 12/17/2014 1329   BUN 17 05/18/2016 0856   CREATININE 1.05 05/18/2016 0856   CALCIUM 9.6 05/18/2016 0856   PROT 7.4 05/18/2016 0856   ALBUMIN 4.6 05/18/2016 0856   AST 18 05/18/2016 0856   ALT 21 05/18/2016 0856   ALKPHOS 124 (H) 05/18/2016 0856   BILITOT 0.4 05/18/2016 0856   GFRNONAA 78 05/18/2016 0856   GFRAA 91 05/18/2016 0856   Lab Results  Component Value Date   CHOL 184 12/18/2014   HDL 56 12/18/2014   LDLCALC 108 (H) 12/18/2014   TRIG 98 12/18/2014   CHOLHDL 3.3 12/18/2014   Lab Results  Component Value Date   HGBA1C 5.4 12/18/2014   No results found for: VITAMINB12 No results found for: TSH    ASSESSMENT AND PLAN 58 y.o.  year old male  has a past medical history of Aneurysm of anterior cerebral artery, Cervical radiculopathy, Headache(784.0), Hemianopsia, High blood pressure, migraine headaches, Malformation, Seizures ([place]), and Stroke ([place]) (10/2011). here with:  1.  Seizures 2. right homonymous visual field deficit  Overall the patient has done well.  He will continue on Dilantin 300 mg daily.  I will check blood work today.  He is advised that if he has any seizure events he should let us know.  He will follow-up in 6 months or sooner if needed.     Ward Givens, MSN, NP-C 05/29/2019, 8:01 AM [facility] [address] [place], [place] [address] [phone]

## 2019-05-29 NOTE — Patient Instructions (Signed)
Continue Dilantin  Blood work today If you have any seizure events please let us know.    

## 2019-05-30 LAB — CBC WITH DIFFERENTIAL/PLATELET
Basophils Absolute: 0 10*3/uL (ref 0.0–0.2)
Basos: 1 %
EOS (ABSOLUTE): 0.1 10*3/uL (ref 0.0–0.4)
Eos: 3 %
Hematocrit: 42.9 % (ref 37.5–51.0)
Hemoglobin: 14.8 g/dL (ref 13.0–17.7)
Immature Grans (Abs): 0 10*3/uL (ref 0.0–0.1)
Immature Granulocytes: 0 %
Lymphocytes Absolute: 1.3 10*3/uL (ref 0.7–3.1)
Lymphs: 34 %
MCH: 33.6 pg — ABNORMAL HIGH (ref 26.6–33.0)
MCHC: 34.5 g/dL (ref 31.5–35.7)
MCV: 98 fL — ABNORMAL HIGH (ref 79–97)
Monocytes Absolute: 0.4 10*3/uL (ref 0.1–0.9)
Monocytes: 11 %
Neutrophils Absolute: 2 10*3/uL (ref 1.4–7.0)
Neutrophils: 51 %
Platelets: 185 10*3/uL (ref 150–450)
RBC: 4.4 x10E6/uL (ref 4.14–5.80)
RDW: 13.1 % (ref 11.6–15.4)
WBC: 3.8 10*3/uL (ref 3.4–10.8)

## 2019-05-30 LAB — PHENYTOIN LEVEL, TOTAL: Phenytoin (Dilantin), Serum: 13.1 ug/mL (ref 10.0–20.0)

## 2019-05-30 LAB — COMPREHENSIVE METABOLIC PANEL
ALT: 17 IU/L (ref 0–44)
AST: 14 IU/L (ref 0–40)
Albumin/Globulin Ratio: 1.7 (ref 1.2–2.2)
Albumin: 4.7 g/dL (ref 3.8–4.9)
Alkaline Phosphatase: 113 IU/L (ref 39–117)
BUN/Creatinine Ratio: 18 (ref 10–24)
BUN: 27 mg/dL (ref 8–27)
Bilirubin Total: 0.2 mg/dL (ref 0.0–1.2)
CO2: 24 mmol/L (ref 20–29)
Calcium: 9.9 mg/dL (ref 8.6–10.2)
Chloride: 103 mmol/L (ref 96–106)
Creatinine, Ser: 1.49 mg/dL — ABNORMAL HIGH (ref 0.76–1.27)
GFR calc Af Amer: 58 mL/min/{1.73_m2} — ABNORMAL LOW (ref >59)
GFR calc non Af Amer: 50 mL/min/{1.73_m2} — ABNORMAL LOW (ref >59)
Globulin, Total: 2.8 g/dL (ref 1.5–4.5)
Glucose: 96 mg/dL (ref 65–99)
Potassium: 4.9 mmol/L (ref 3.5–5.2)
Sodium: 142 mmol/L (ref 134–144)
Total Protein: 7.5 g/dL (ref 6.0–8.5)

## 2019-05-31 ENCOUNTER — Telehealth: Payer: Self-pay | Admitting: *Deleted

## 2019-05-31 NOTE — Telephone Encounter (Signed)
I called pt and relayed that the lab results that were done did show some creatnine elevation and GFR decrease, otherwise ok.  Will forward to Dr. Reynaldo Minium per MM/NP request. Pt verbalized understanding.

## 2019-05-31 NOTE — Telephone Encounter (Signed)
-----   Message from Ward Givens, NP sent at 05/30/2019  2:33 PM EDT ----- Creatinine has increased and GFR has decreased.  Otherwise blood work is unremarkable.  Please send to primary care provider.  Please call patient with results

## 2019-06-01 NOTE — Progress Notes (Signed)
I have read the note, and I agree with the clinical assessment and plan.  Eric Craig   

## 2020-01-25 ENCOUNTER — Ambulatory Visit (AMBULATORY_SURGERY_CENTER): Payer: Self-pay | Admitting: *Deleted

## 2020-01-25 ENCOUNTER — Other Ambulatory Visit: Payer: Self-pay

## 2020-01-25 VITALS — Temp 96.9°F | Ht 71.0 in | Wt 203.0 lb

## 2020-01-25 DIAGNOSIS — Z1211 Encounter for screening for malignant neoplasm of colon: Secondary | ICD-10-CM

## 2020-01-25 DIAGNOSIS — Z01818 Encounter for other preprocedural examination: Secondary | ICD-10-CM

## 2020-01-25 NOTE — Progress Notes (Signed)
No egg or soy allergy known to patient  No issues with past sedation with any surgeries  or procedures, no intubation problems  No diet pills per patient No home 02 use per patient  No blood thinners per patient  Pt denies issues with constipation  No A fib or A flutter  EMMI video sent to pt's e mail  Last seizure 2001 per  pt- on Dilantin   Due to the COVID-19 pandemic we are asking patients to follow these guidelines. Please only bring one care partner. Please be aware that your care partner may wait in the car in the parking lot or if they feel like they will be too hot to wait in the car, they may wait in the lobby on the 4th floor. All care partners are required to wear a mask the entire time (we do not have any that we can provide them), they need to practice social distancing, and we will do a Covid check for all patient's and care partners when you arrive. Also we will check their temperature and your temperature. If the care partner waits in their car they need to stay in the parking lot the entire time and we will call them on their cell phone when the patient is ready for discharge so they can bring the car to the front of the building. Also all patient's will need to wear a mask into building.

## 2020-02-05 ENCOUNTER — Ambulatory Visit (INDEPENDENT_AMBULATORY_CARE_PROVIDER_SITE_OTHER): Payer: 59

## 2020-02-05 ENCOUNTER — Other Ambulatory Visit: Payer: Self-pay | Admitting: Gastroenterology

## 2020-02-05 ENCOUNTER — Other Ambulatory Visit: Payer: Self-pay

## 2020-02-05 DIAGNOSIS — Z1159 Encounter for screening for other viral diseases: Secondary | ICD-10-CM

## 2020-02-06 LAB — SARS CORONAVIRUS 2 (TAT 6-24 HRS): SARS Coronavirus 2: NEGATIVE

## 2020-02-08 ENCOUNTER — Other Ambulatory Visit: Payer: Self-pay

## 2020-02-08 ENCOUNTER — Ambulatory Visit (AMBULATORY_SURGERY_CENTER): Payer: 59 | Admitting: Gastroenterology

## 2020-02-08 ENCOUNTER — Encounter: Payer: Self-pay | Admitting: Gastroenterology

## 2020-02-08 VITALS — BP 117/77 | HR 70 | Temp 97.3°F | Resp 10 | Ht 71.0 in | Wt 203.0 lb

## 2020-02-08 DIAGNOSIS — Z1211 Encounter for screening for malignant neoplasm of colon: Secondary | ICD-10-CM

## 2020-02-08 DIAGNOSIS — D122 Benign neoplasm of ascending colon: Secondary | ICD-10-CM

## 2020-02-08 DIAGNOSIS — K635 Polyp of colon: Secondary | ICD-10-CM

## 2020-02-08 MED ORDER — SODIUM CHLORIDE 0.9 % IV SOLN: 500.0000 mL | Freq: Once | INTRAVENOUS | Status: DC

## 2020-02-08 NOTE — Patient Instructions (Addendum)
YOU HAD AN ENDOSCOPIC PROCEDURE TODAY AT [facility]:   Refer to the procedure report that was given to you for any specific questions about what was found during the examination.  If the procedure report does not answer your questions, please call your gastroenterologist to clarify.  If you requested that your care partner not be given the details of your procedure findings, then the procedure report has been included in a sealed envelope for you to review at your convenience later.  YOU SHOULD EXPECT: Some feelings of bloating in the abdomen. Passage of more gas than usual.  Walking can help get rid of the air that was put into your GI tract during the procedure and reduce the bloating. If you had a lower endoscopy (such as a colonoscopy or flexible sigmoidoscopy) you may notice spotting of blood in your stool or on the toilet paper. If you underwent a bowel prep for your procedure, you may not have a normal bowel movement for a few days.  **Handouts given on polyps, diverticulosis, Hemorrhoids, High fiber diet** **NO Ibuprofen, advil, aleve or motrin for 2-3 weeks** **You may need to take a fiber supplement, Fibercon daily is a good choice**  Please Note:  You might notice some irritation and congestion in your nose or some drainage.  This is from the oxygen used during your procedure.  There is no need for concern and it should clear up in a day or so.  SYMPTOMS TO REPORT IMMEDIATELY:   Following lower endoscopy (colonoscopy or flexible sigmoidoscopy):  Excessive amounts of blood in the stool  Significant tenderness or worsening of abdominal pains  Swelling of the abdomen that is new, acute  Fever of 100F or higher   For urgent or emergent issues, a gastroenterologist can be reached at any hour by calling [phone]. Do not use MyChart messaging for urgent concerns.    DIET:  We do recommend a small meal at first, but then you may proceed to your regular diet.  Drink  plenty of fluids but you should avoid alcoholic beverages for 24 hours.  ACTIVITY:  You should plan to take it easy for the rest of today and you should NOT DRIVE or use heavy machinery until tomorrow (because of the sedation medicines used during the test).    FOLLOW UP: Our staff will call the number listed on your records 48-72 hours following your procedure to check on you and address any questions or concerns that you may have regarding the information given to you following your procedure. If we do not reach you, we will leave a message.  We will attempt to reach you two times.  During this call, we will ask if you have developed any symptoms of COVID 19. If you develop any symptoms (ie: fever, flu-like symptoms, shortness of breath, cough etc.) before then, please call [phone].  If you test positive for Covid 19 in the 2 weeks post procedure, please call and report this information to [place].    If any biopsies were taken you will be contacted by phone or by letter within the next 1-3 weeks.  Please call us at [phone] if you have not heard about the biopsies in 3 weeks.    SIGNATURES/CONFIDENTIALITY: You and/or your care partner have signed paperwork which will be entered into your electronic medical record.  These signatures attest to the fact that that the information above on your After Visit Summary has been reviewed and is understood.  Full responsibility of the confidentiality of this discharge information lies with you and/or your care-partner.

## 2020-02-08 NOTE — Progress Notes (Signed)
Vitals-CW Temp-JB  Pt's states no medical or surgical changes since previsit or office visit. 

## 2020-02-08 NOTE — Op Note (Signed)
[facility] Patient Name: Eric Craig Procedure Date: 02/08/2020 12:33 PM MRN: [id number] Endoscopist: Justice Britain , MD Age: 59 Referring MD:  Date of Birth: Mar 10, 1961 Gender: Male Account #: [id number] Procedure:                Colonoscopy Indications:              Screening for colorectal malignant neoplasm, This                            is the patient's first colonoscopy Medicines:                Monitored Anesthesia Care Procedure:                Pre-Anesthesia Assessment:                           - Prior to the procedure, a History and Physical                            was performed, and patient medications and                            allergies were reviewed. The patient's tolerance of                            previous anesthesia was also reviewed. The risks                            and benefits of the procedure and the sedation                            options and risks were discussed with the patient.                            All questions were answered, and informed consent                            was obtained. Prior Anticoagulants: The patient has                            taken no previous anticoagulant or antiplatelet                            agents. ASA Grade Assessment: II - A patient with                            mild systemic disease. After reviewing the risks                            and benefits, the patient was deemed in                            satisfactory condition to undergo the procedure.  After obtaining informed consent, the colonoscope                            was passed under direct vision. Throughout the                            procedure, the patient's blood pressure, pulse, and                            oxygen saturations were monitored continuously. The                            Colonoscope was introduced through the anus and                            advanced to the 5 cm  into the ileum. The                            colonoscopy was performed without difficulty. The                            patient tolerated the procedure. The quality of the                            bowel preparation was adequate. The terminal ileum,                            ileocecal valve, appendiceal orifice, and rectum                            were photographed. Scope In: 12:52:24 PM Scope Out: 1:20:52 PM Scope Withdrawal Time: 0 hours 23 minutes 59 seconds  Total Procedure Duration: 0 hours 28 minutes 28 seconds  Findings:                 The digital rectal exam findings include                            hemorrhoids. Pertinent negatives include no                            palpable rectal lesions.                           The terminal ileum and ileocecal valve appeared                            normal.                           A 25 mm polyp was found in the proximal ascending                            colon. The polyp was sessile. Preparations were  made for mucosal resection. 12 cc of Saline was                            injected to raise the lesion. Piecemeal mucosal                            resection using a snare was performed. Resection                            and retrieval were complete. To prevent bleeding                            after mucosal resection, two hemostatic clips were                            successfully placed (MR conditional) to close the                            defect. There was no bleeding at the end of the                            procedure.                           Multiple small-mouthed diverticula were found in                            the sigmoid colon.                           Normal mucosa was found in the entire colon                            otherwise.                           Non-bleeding non-thrombosed external and internal                            hemorrhoids were found during  retroflexion, during                            perianal exam and during digital exam. The                            hemorrhoids were Grade II (internal hemorrhoids                            that prolapse but reduce spontaneously). Complications:            No immediate complications. Estimated Blood Loss:     Estimated blood loss was minimal. Impression:               - Hemorrhoids found on digital rectal exam.                           -  The examined portion of the ileum was normal.                           - One 25 mm polyp in the proximal ascending colon,                            removed with piecemeal mucosal resection. Resected                            and retrieved. Clips (MR conditional) were placed.                           - Diverticulosis in the sigmoid colon.                           - Normal mucosa in the entire examined colon.                           - Non-bleeding non-thrombosed external and internal                            hemorrhoids. Recommendation:           - The patient will be observed post-procedure,                            until all discharge criteria are met.                           - Discharge patient to home.                           - Patient has a contact number available for                            emergencies. The signs and symptoms of potential                            delayed complications were discussed with the                            patient. Return to normal activities tomorrow.                            Written discharge instructions were provided to the                            patient.                           - High fiber diet.                           - Use FiberCon 1 tablet PO daily.                           -  Continue present medications.                           - Await pathology results.                           - Repeat colonoscopy in 6-12 months for                            surveillance in setting of  EMR piecemeal resection.                           - The findings and recommendations were discussed                            with the patient.                           - The findings and recommendations were discussed                            with the patient's family. Justice Britain, MD 02/08/2020 1:27:05 PM

## 2020-02-08 NOTE — Progress Notes (Signed)
A/ox3, pleased with MAC, report to RN 

## 2020-02-08 NOTE — Progress Notes (Signed)
Called to room to assist during endoscopic procedure.  Patient ID and intended procedure confirmed with present staff. Received instructions for my participation in the procedure from the performing physician.  

## 2020-02-12 ENCOUNTER — Encounter: Payer: Self-pay | Admitting: Gastroenterology

## 2020-02-12 ENCOUNTER — Telehealth: Payer: Self-pay | Admitting: *Deleted

## 2020-02-12 NOTE — Telephone Encounter (Signed)
  Follow up Call-  Call back number 02/08/2020  Post procedure Call Back phone  # [phone]  Permission to leave phone message Yes  Some recent data might be hidden     Patient questions:  Do you have a fever, pain , or abdominal swelling? No. Pain Score  0 *  Have you tolerated food without any problems? Yes.    Have you been able to return to your normal activities? Yes.    Do you have any questions about your discharge instructions: Diet   No. Medications  No. Follow up visit  No.  Do you have questions or concerns about your Care? Yes.  Patient had a little blood when he had his first BM.  Slight amount in the toilet and on the toilet paper.  No pain. Able to eat.  Hasn't had another BM since.  Will keep an eye on it and call us if necessary.  Actions: * If pain score is 4 or above: No action needed, pain <4.  1. Have you developed a fever since your procedure? no  2.   Have you had an respiratory symptoms (SOB or cough) since your procedure? no  3.   Have you tested positive for COVID 19 since your procedureno  4.   Have you had any family members/close contacts diagnosed with the COVID 19 since your procedure?  no   If yes to any of these questions please route to Joylene John, RN and Erenest Rasher, RN

## 2020-05-29 ENCOUNTER — Ambulatory Visit: Payer: 59 | Admitting: Adult Health

## 2020-06-04 ENCOUNTER — Other Ambulatory Visit: Payer: Self-pay | Admitting: *Deleted

## 2020-06-04 MED ORDER — DILANTIN 100 MG PO CAPS: 300.0000 mg | ORAL_CAPSULE | Freq: Every day | ORAL | 3 refills | Status: DC

## 2020-07-25 ENCOUNTER — Telehealth: Payer: Self-pay | Admitting: *Deleted

## 2020-07-25 NOTE — Telephone Encounter (Signed)
Received dilantin level fro pt from [facility]  Dr. Reynaldo Minium.  RESULT:: 13.6.  Report in inbox.

## 2020-07-30 NOTE — Telephone Encounter (Signed)
LMVM for pt to return call to set up appt, may do VV if willing.  [place] sent fax dilantin level normal study.  Last seen 05-2019.

## 2020-07-30 NOTE — Telephone Encounter (Signed)
Dilantin is in normal range.  The patient's last appointment was August 2020.  He needs to schedule a follow-up with our office.  Okay to schedule a virtual visit

## 2020-08-13 ENCOUNTER — Ambulatory Visit: Payer: 59 | Admitting: Adult Health

## 2020-09-19 ENCOUNTER — Ambulatory Visit: Payer: 59 | Admitting: Neurology

## 2020-09-19 ENCOUNTER — Encounter: Payer: Self-pay | Admitting: Neurology

## 2020-09-19 VITALS — BP 155/93 | HR 87 | Ht 72.0 in | Wt 208.0 lb

## 2020-09-19 DIAGNOSIS — H53461 Homonymous bilateral field defects, right side: Secondary | ICD-10-CM

## 2020-09-19 DIAGNOSIS — G40909 Epilepsy, unspecified, not intractable, without status epilepticus: Secondary | ICD-10-CM

## 2020-09-19 MED ORDER — PHENYTOIN SODIUM EXTENDED 100 MG PO CAPS: 300.0000 mg | ORAL_CAPSULE | Freq: Every day | ORAL | 3 refills | Status: DC

## 2020-09-19 NOTE — Progress Notes (Signed)
Reason for visit: Seizures, occipital AVM  Eric Craig is an 59 y.o. male  History of present illness:  Eric Craig is a 59 year old left-handed white male with a history of seizures. 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 The patient has done well with his seizures, the last seizure was in 2001. The patient remains on brand name Dilantin. He recently had a blood level done through his primary care physician of 13.6. The patient tolerates the drug well. He wishes to switch to a generic medication to save money. He returns to this office for an evaluation. No new medical issues have come up since last seen, the patient has had cataract surgery.  Past Medical History:  Diagnosis Date  . Allergy   . Anemia    1 x in the mid 80's  . 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 Arthritis    right thumb   . Cataract    removed both eyes with lens implants   . Cervical radiculopathy    recurrent  . Headache(784.0)   . Hemianopsia    residual right homoinmous  . High blood pressure   . Hx of migraine headaches   . Malformation    occipital arteriovenous  . Seizures ([place])    Last seizure was 2001  . 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 BRAIN SURGERY     Basilar artery aneurysm repair in 1997, with facial reconstruction in 1998  . C5-C6 laminectomy  1/89  . cataract Bilateral 06/2018  . CERVICAL DISCECTOMY  6/89   C5-C6-7:04/1992, C4-5, w/ plate fusion for left C5 radiculopathy 12/00  . Invasive loop procedure  [place] for treatment of the AVM   . NECK SURGERY     C4-5 repair in 1989. C6-7 repair in 1993 and 2001. Residual left arm weakness  . removal of the arteriovenous malformation  1997   [place] [facility] [place]  . right temporal craniotomy  2/97   basilarr artery aneurysm     Family History  Problem Relation Age of Onset  . Aortic aneurysm Mother 87  . Hypertension Father   . Heart disease Other   . Cancer Brother   . Prostate cancer Brother   . Colon cancer Neg Hx   . Colon polyps Neg Hx   . Esophageal cancer Neg Hx   . Rectal cancer Neg Hx   . Stomach cancer Neg Hx     Social history:  reports that he has quit smoking. His smoking use included cigarettes. He has a 40.00 pack-year smoking history. He has never used smokeless tobacco. He reports current alcohol use. He reports that he does not use drugs.   No Known Allergies  Medications:  Prior to Admission medications   Medication Sig Start Date End Date Taking? Authorizing Provider  amLODipine (NORVASC) 5 MG tablet Take 5 mg by mouth daily.   Yes [provider]  calcium carbonate (OS-CAL) 600 MG TABS Take 600 mg by mouth daily.   Yes [provider]  CIALIS 20 MG tablet Take 20 mg by mouth as needed.  10/26/15  Yes [provider]  DILANTIN 100 MG ER capsule Take 3 capsules (300 mg total) by mouth daily. 06/04/20  Yes Ward Givens, NP  fluticasone (FLONASE) 50 MCG/ACT nasal spray Place 2 sprays into both nostrils at bedtime. Patient taking differently: Place 2 sprays into both nostrils as needed. 12/06/15  Yes Waldemar Dickens, MD  losartan-hydrochlorothiazide (HYZAAR) 100-25 MG tablet Take 1 tablet by mouth daily. 01/17/20  Yes [provider]  Multiple Vitamin (MULTIVITAMIN) capsule Take 1 capsule by mouth daily. am   Yes [provider]  olmesartan-hydrochlorothiazide (BENICAR HCT) 40-25 MG tablet Take 1 tablet by mouth daily. 05/17/19   [provider]    ROS:  Out of a complete 14 system review of symptoms, the patient complains only of the following symptoms, and all other reviewed systems are negative.  History of seizures Visual deficit  Blood pressure (!) 155/93, pulse 87, height 6' (1.829 m), weight 208 lb (94.3 kg).  Physical  Exam  General: The patient is alert and cooperative at the time of the examination.  Skin: No significant peripheral edema is noted.   Neurologic Exam  Mental status: The patient is alert and oriented x 3 at the time of the examination. The patient has apparent normal recent and remote memory, with an apparently normal attention span and concentration ability.   Cranial nerves: Facial symmetry is present. Speech is normal, no aphasia or dysarthria is noted. Extraocular movements are full. Visual fields are notable for a dense right homonymous visual field deficit.  Motor: The patient has good strength in all 4 extremities.  Sensory examination: Soft touch sensation is symmetric on the face, arms, and legs.  Coordination: The patient has good finger-nose-finger and heel-to-shin bilaterally.  Gait and station: The patient has a normal gait. Tandem gait is unsteady. Romberg is negative. No drift is seen.  Reflexes: Deep tendon reflexes are symmetric.   Assessment/Plan:  1. History of seizures, well controlled  2. History of occipital AVM, right homonymous visual field deficit  The patient wishes to switch to generic phenytoin, this should be reasonable at this point as the patient has been well controlled for quite a number of years. The patient be given a prescription for the generic medication, he will call me when he starts the generic drug, and we will check blood work about 3 weeks after he has been on the generic medication and compare blood levels to what was done recently. The patient will follow up here in 1 year.  Jill Alexanders MD 09/19/2020 8:42 AM  [facility] [address] [place], [place] [address]  Phone [phone] Fax [phone]

## 2020-09-19 NOTE — Patient Instructions (Signed)
Call after you start the generic Dilantin.

## 2021-09-11 ENCOUNTER — Telehealth: Payer: Self-pay | Admitting: Neurology

## 2021-09-11 NOTE — Telephone Encounter (Signed)
Eric Craig was scheduled to see Eric Craig on 12/12, but she will not be here. I spoke to Eric Craig and explained that he will be seeing Dr. April Manson now that Dr. Jannifer Franklin is retired and scheduled him for 10/17/21 the first available appointment we had. He is ok with waiting until January to see Dr. April Manson, but he said that he has extended his deadline for [facility] paper work 3 times and needs it back to them by December 12. I told him he can drop the paperwork off at the office and we will work on getting it filled out for him. Eric Craig says he will drop it off today 09/11/21.

## 2021-09-15 DIAGNOSIS — Z0289 Encounter for other administrative examinations: Secondary | ICD-10-CM

## 2021-09-15 NOTE — Telephone Encounter (Signed)
Eric Craig had a cancellation tomorrow so I called the patient back and he agreed to see her at 1:15PM instead of [name] in the morning. I cancelled his appointment with Dr. April Manson in January.

## 2021-09-15 NOTE — Telephone Encounter (Signed)
Patient is coming in tomorrow 12/6 to see Janett Billow

## 2021-09-16 ENCOUNTER — Ambulatory Visit: Payer: 59 | Admitting: Neurology

## 2021-09-16 ENCOUNTER — Encounter: Payer: Self-pay | Admitting: Neurology

## 2021-09-16 ENCOUNTER — Ambulatory Visit: Payer: 59 | Admitting: Adult Health

## 2021-09-16 VITALS — BP 147/85 | HR 84 | Ht 72.0 in | Wt 207.0 lb

## 2021-09-16 DIAGNOSIS — G40909 Epilepsy, unspecified, not intractable, without status epilepticus: Secondary | ICD-10-CM | POA: Diagnosis not present

## 2021-09-16 DIAGNOSIS — H53461 Homonymous bilateral field defects, right side: Secondary | ICD-10-CM | POA: Diagnosis not present

## 2021-09-16 MED ORDER — PHENYTOIN SODIUM EXTENDED 100 MG PO CAPS: 300.0000 mg | ORAL_CAPSULE | Freq: Every day | ORAL | 3 refills | Status: DC

## 2021-09-16 NOTE — Progress Notes (Signed)
PATIENT: Eric Craig DOB: 01/15/1961  REASON FOR VISIT: follow up HISTORY FROM: patient Primary Neurologist: Dr. Jannifer Franklin, will be followed by Dr. April Manson   HISTORY OF PRESENT ILLNESS: Today 09/16/21 Eric Craig is a 60 year old male with history of seizures. 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  His last seizure was in 2001.  After last visit, he was switched from brand-name Dilantin to generic.  He has done well with this.  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  Had blood work done at PCP, reports was unremarkable, Dilantin level was reported around 8.  He mentions the idea of decreasing or completely coming off the Dilantin since it has been so long since his last seizure.  After probing, reports the Dilantin has resulted in some sexual side effects.  Claims his last seizures occurred when he was heavily drinking alcohol, he now rarely drinks.  Here today for evaluation unaccompanied.  HISTORY  09/19/2020 Dr. Jannifer Franklin: Eric Craig is a 60 year old left-handed white male with a history of seizures. 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 The patient has done well with his seizures, the last seizure was in 2001. The patient remains on brand name Dilantin. He recently had a blood level done through his primary care physician of 13.6. The patient tolerates the drug well. He wishes to switch to a generic medication to save money. He returns to this office for an evaluation. No new medical issues have come up since last seen, the patient has had cataract surgery.  REVIEW OF SYSTEMS: Out of a complete 14 system review of symptoms, the patient complains only of the following symptoms, and all other reviewed systems are negative.  See HPI  ALLERGIES: No Known Allergies  HOME MEDICATIONS: Outpatient  Medications Prior to Visit  Medication Sig Dispense Refill   amLODipine (NORVASC) 5 MG tablet Take 5 mg by mouth daily.     calcium carbonate (OS-CAL) 600 MG TABS Take 600 mg by mouth daily.     CIALIS 20 MG tablet Take 20 mg by mouth as needed.   2   Multiple Vitamin (MULTIVITAMIN) capsule Take 1 capsule by mouth daily. am     olmesartan-hydrochlorothiazide (BENICAR HCT) 40-25 MG tablet Take 1 tablet by mouth daily.     phenytoin (DILANTIN) 100 MG ER capsule Take 3 capsules (300 mg total) by mouth daily. 270 capsule 3   fluticasone (FLONASE) 50 MCG/ACT nasal spray Place 2 sprays into both nostrils at bedtime. (Patient taking differently: Place 2 sprays into both nostrils as needed.) 16 g 0   losartan-hydrochlorothiazide (HYZAAR) 100-25 MG tablet Take 1 tablet by mouth daily.     No facility-administered medications prior to visit.    PAST MEDICAL HISTORY: Past Medical History:  Diagnosis Date   Allergy    Anemia    1 x in the mid 80's   Aneurysm of anterior cerebral artery    Basilar artery aneurysm repair in 1997   Arthritis    right thumb    Cataract    removed both eyes with lens implants    Cervical radiculopathy    recurrent   Headache(784.0)    Hemianopsia    residual right homoinmous   High blood pressure    Hx of migraine headaches    Malformation  occipital arteriovenous   Seizures ([place])    Last seizure was 2001   Stroke [facility]) 10/2011   brainstem, not seen on MRI    PAST SURGICAL HISTORY: Past Surgical History:  Procedure Laterality Date   BRAIN SURGERY     Basilar artery aneurysm repair in 1997, with facial reconstruction in 1998   C5-C6 laminectomy  1/89   cataract Bilateral 06/2018   CERVICAL DISCECTOMY  6/89   C5-C6-7:04/1992, C4-5, w/ plate fusion for left C5 radiculopathy 12/00   Invasive loop procedure  [place] for treatment of the AVM    NECK SURGERY     C4-5 repair in 1989. C6-7 repair in 1993 and 2001. Residual left arm weakness    removal of the arteriovenous malformation  1997   [place]   right temporal craniotomy  2/97   basilarr artery aneurysm    FAMILY HISTORY: Family History  Problem Relation Age of Onset   Aortic aneurysm Mother 55   Hypertension Father    Heart disease Other    Cancer Brother    Prostate cancer Brother    Colon cancer Neg Hx    Colon polyps Neg Hx    Esophageal cancer Neg Hx    Rectal cancer Neg Hx    Stomach cancer Neg Hx     SOCIAL HISTORY: Social History   Socioeconomic History   Marital status: Single    Spouse name: Not on file   Number of children: 2   Years of education: 12   Highest education level: Not on file  Occupational History   Occupation: Programmer, 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  Substance and Sexual Activity   Alcohol use: Yes    Alcohol/week: 0.0 standard drinks    Comment: Drinks 12 beers per year   Drug use: No   Sexual activity: Not on file  Other Topics Concern   Not on file  Social History Narrative   Lives alone   Patient is left handed   Caffeine consumption is 4 cups daily   Social Determinants of Health   Financial Resource Strain: Not on file  Food Insecurity: Not on file  Transportation Needs: Not on file  Physical Activity: Not on file  Stress: Not on file  Social Connections: Not on file  Intimate Partner Violence: Not on file    PHYSICAL EXAM  Vitals:   09/16/21 1309  BP: (!) 147/85  Pulse: 84  Weight: 207 lb (93.9 kg)  Height: 6' (1.829 m)   Body mass index is 28.07 kg/m.  Generalized: Well developed, in no acute distress  Neurological examination  Mentation: Alert oriented to time, place, history taking. Follows all commands speech and language fluent Cranial nerve II-XII: Pupils were equal round  reactive to light. Extraocular movements were full, right homonymous visual field deficit.  Facial sensation and strength were normal. Head turning and shoulder shrug  were normal and symmetric. Motor: The motor testing reveals 5 over 5 strength of all 4 extremities. Good symmetric motor tone is noted throughout.  Sensory: Sensory testing is intact to soft touch on all 4 extremities. No evidence of extinction is noted.  Coordination: Cerebellar testing reveals good finger-nose-finger and  heel-to-shin bilaterally.  Gait and station: Gait is normal.  Reflexes: Deep tendon reflexes are symmetric and normal bilaterally.   DIAGNOSTIC DATA (LABS, IMAGING, TESTING) - I reviewed patient records, labs, notes, testing and imaging myself where available.  Lab Results  Component Value Date   WBC 3.8 05/29/2019   HGB 14.8 05/29/2019   HCT 42.9 05/29/2019   MCV 98 (H) 05/29/2019   PLT 185 05/29/2019      Component Value Date/Time   NA 142 05/29/2019 0830   K 4.9 05/29/2019 0830   CL 103 05/29/2019 0830   CO2 24 05/29/2019 0830   GLUCOSE 96 05/29/2019 0830   GLUCOSE 96 12/17/2014 1329   BUN 27 05/29/2019 0830   CREATININE 1.49 (H) 05/29/2019 0830   CALCIUM 9.9 05/29/2019 0830   PROT 7.5 05/29/2019 0830   ALBUMIN 4.7 05/29/2019 0830   AST 14 05/29/2019 0830   ALT 17 05/29/2019 0830   ALKPHOS 113 05/29/2019 0830   BILITOT 0.2 05/29/2019 0830   GFRNONAA 50 (L) 05/29/2019 0830   GFRAA 58 (L) 05/29/2019 0830   Lab Results  Component Value Date   CHOL 184 12/18/2014   HDL 56 12/18/2014   LDLCALC 108 (H) 12/18/2014   TRIG 98 12/18/2014   CHOLHDL 3.3 12/18/2014   Lab Results  Component Value Date   HGBA1C 5.4 12/18/2014   No results found for: VITAMINB12 No results found for: TSH  ASSESSMENT AND PLAN 60 y.o. year old male  has a past medical history of Allergy, Anemia, Aneurysm of anterior cerebral artery, Arthritis, Cataract, Cervical radiculopathy, Headache(784.0), Hemianopsia, High  blood pressure, migraine headaches, Malformation, Seizures ([place]), and Stroke ([place]) (10/2011). here with:  1.  History of seizures, well controlled 2.  History of occipital AVM, right homonymous visual field deficit  -Last seizure was in 2001, he is very interested in decreasing or completely coming off Dilantin, since he has had such a long interval being seizure-free -Review of chart per Dr. Erling Cruz in 2014, history of partial complex and secondary generalized seizures in 1979, left occipital AVM hemorrhage in 1997 -Not clear coming off seizure medication is the best choice given his history, however, we will start with EEG and make a decision going forward, will consult with Dr. April Manson once EEG results about medication adjustment, if side effect is the issue, we can consider another medication -For now, continue Dilantin, chart review shows in October 2022 Dilantin level was 8.4, has DMV papers to complete -Call for any seizures, will keep follow-up at 1 year for now, will adjust if medication changes are made  Butler Denmark, AGNP-C, DNP 09/16/2021, 1:38 PM [facility] [address], [place], [place] [address] [phone]

## 2021-09-16 NOTE — Patient Instructions (Addendum)
Check EEG today  For now continue the Dilantin  Will discuss any changes after EEG  See you back in 1 year

## 2021-09-18 ENCOUNTER — Telehealth: Payer: Self-pay | Admitting: *Deleted

## 2021-09-18 NOTE — Telephone Encounter (Signed)
Dmv form faxed on 09/18/21

## 2021-09-22 ENCOUNTER — Ambulatory Visit: Payer: 59 | Admitting: Neurology

## 2021-09-22 DIAGNOSIS — G40909 Epilepsy, unspecified, not intractable, without status epilepticus: Secondary | ICD-10-CM | POA: Diagnosis not present

## 2021-09-23 ENCOUNTER — Telehealth: Payer: Self-pay | Admitting: Neurology

## 2021-09-23 NOTE — Telephone Encounter (Signed)
Please call the patient. Recent EEG shows frequent right temporal sharps, consistent with area of seizure potential. Would NOT recommend coming off AED. Would recommend continuing on Dilantin, reviewed with Dr. April Manson.   Impression: This is an abnormal EEG recording in the waking and drowsy state due to:  Frequent right temporal sharps, maximum at T8 Right temporal focal slowing.    Right temporal sharps is consistent with an area of epileptogenic potential and right temporal region and right temporal slowing is consistent with area of neuronal dysfunction in the right temporal region.

## 2021-09-23 NOTE — Procedures (Signed)
    History:  14 yom with seizure disorder  EEG classification: Awake and drowsy  Description of the recording: The background rhythms of this recording consists of a fairly well modulated medium amplitude alpha rhythm of 9 Hz that is reactive to eye opening and closure. As the record progresses, the patient appears to remain in the waking state throughout the recording. Photic stimulation was performed, did not show any abnormalities. Hyperventilation was also performed, did show focal right hemispheric slowing. Toward the end of the recording, the patient enters the drowsy state with slight symmetric slowing seen. The patient never enters stage II sleep. There are frequent right temporal sharps, maximal at T8. There is right temporal focal slowing. EKG monitor shows no evidence of cardiac rhythm abnormalities with a heart rate of 84.  Impression: This is an abnormal EEG recording in the waking and drowsy state due to:  Frequent right temporal sharps, maximum at T8 Right temporal focal slowing.   Right temporal sharps is consistent with an area of epileptogenic potential and right temporal region and right temporal slowing is consistent with area of neuronal dysfunction in the right temporal region.    Alric Ran, MD [facility]

## 2021-09-23 NOTE — Telephone Encounter (Signed)
I called patient. I discussed the EEG results and recommendations with him. He will continue the dilantin as prescribed and follow up in December. He will call with interim questions or concerns. Pt verbalized understanding of recommendations and results.

## 2021-10-17 ENCOUNTER — Ambulatory Visit: Payer: 59 | Admitting: Neurology

## 2022-05-11 ENCOUNTER — Telehealth: Payer: Self-pay | Admitting: Neurology

## 2022-05-11 NOTE — Telephone Encounter (Signed)
Pt said, [facility] phenytoin (DILANTIN) 100 MG ER capsule is out of stock. [facility] has been trying to get in contact with you. They want to know if there is a medication can replace until get Phenytoin in stock. Would like a call from the nurse.  [facility] Reference no: [id number]

## 2022-05-12 MED ORDER — PHENYTOIN SODIUM EXTENDED 100 MG PO CAPS: 300.0000 mg | ORAL_CAPSULE | Freq: Every day | ORAL | 1 refills | Status: DC

## 2022-05-12 NOTE — Telephone Encounter (Signed)
I called [facility] [phone]) and spoke to the pharmacist who informed me his phenytoin '100mg'$  ER is out of stock indefinitely. They will keep his order open and contact him when it becomes available.   [facility] [place] of supply. I spoke to the patient and he is agreeable to this plan. He would just [facility] and he will start going there for now.

## 2022-09-15 NOTE — Progress Notes (Unsigned)
PATIENT: Eric Craig DOB: 10/05/1961  REASON FOR VISIT: follow up HISTORY FROM: patient Primary Neurologist: Dr. Jannifer Franklin, will be followed by Dr. April Manson   HISTORY OF PRESENT ILLNESS: Today 09/16/22 He had an abnormal EEG in December 2022 showing frequent right temporal sharps consistent with an area epileptogenic potential and right temporal slowing.  Given abnormal EEG we would not recommend coming off seizure medication.  He has remained on Dilantin 300 mg daily in the AM, denies side effects. No seizures. He is now on rosuvastatin.  Has labs done with PCP in October Dilantin level was 12.2.  Update 09/16/21 SS: Eric Craig is a 61 year old male with history of seizures. 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  His last seizure was in 2001.  After last visit, he was switched from brand-name Dilantin to generic.  He has done well with this.  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  Had blood work done at PCP, reports was unremarkable, Dilantin level was reported around 8.  He mentions the idea of decreasing or completely coming off the Dilantin since it has been so long since his last seizure.  After probing, reports the Dilantin has resulted in some sexual side effects.  Claims his last seizures occurred when he was heavily drinking alcohol, he now rarely drinks.  Here today for evaluation unaccompanied.  HISTORY  09/19/2020 Dr. Jannifer Franklin: Eric Craig is a 61 year old left-handed white male with a history of seizures. 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 The patient has done well with his seizures, the last seizure was in 2001. The patient remains on brand name Dilantin. He recently had a blood level done through his primary care physician of 13.6. The patient tolerates the drug well. He wishes to switch to a  generic medication to save money. He returns to this office for an evaluation. No new medical issues have come up since last seen, the patient has had cataract surgery.  REVIEW OF SYSTEMS: Out of a complete 14 system review of symptoms, the patient complains only of the following symptoms, and all other reviewed systems are negative.  See HPI  ALLERGIES: No Known Allergies  HOME MEDICATIONS: Outpatient Medications Prior to Visit  Medication Sig Dispense Refill   amLODipine (NORVASC) 5 MG tablet Take 5 mg by mouth daily.     calcium carbonate (OS-CAL) 600 MG TABS Take 600 mg by mouth daily.     CIALIS 20 MG tablet Take 20 mg by mouth as needed.   2   Multiple Vitamin (MULTIVITAMIN) capsule Take 1 capsule by mouth daily. am     olmesartan-hydrochlorothiazide (BENICAR HCT) 40-25 MG tablet Take 1 tablet by mouth daily.     rosuvastatin (CRESTOR) 10 MG tablet Take 10 mg by mouth at bedtime.     phenytoin (DILANTIN) 100 MG ER capsule Take 3 capsules (300 mg total) by mouth daily. 270 capsule 1   No facility-administered medications prior to visit.    PAST MEDICAL HISTORY: Past Medical History:  Diagnosis Date   Allergy    Anemia    1 x in the mid 80's   Aneurysm of anterior cerebral artery    Basilar artery aneurysm repair in 1997   Arthritis    right thumb    Cataract    removed both  eyes with lens implants    Cervical radiculopathy    recurrent   Headache(784.0)    Hemianopsia    residual right homoinmous   High blood pressure    Hx of migraine headaches    Malformation    occipital arteriovenous   Seizures ([place])    Last seizure was 2001   Stroke ([place]) 10/2011   brainstem, not seen on MRI    PAST SURGICAL HISTORY: Past Surgical History:  Procedure Laterality Date   BRAIN SURGERY     Basilar artery aneurysm repair in 1997, with facial reconstruction in 1998   C5-C6 laminectomy  1/89   cataract Bilateral 06/2018   CERVICAL DISCECTOMY  6/89   C5-C6-7:04/1992, C4-5, w/  plate fusion for left C5 radiculopathy 12/00   Invasive loop procedure  [place] for treatment of the AVM    NECK SURGERY     C4-5 repair in 1989. C6-7 repair in 1993 and 2001. Residual left arm weakness   removal of the arteriovenous malformation  1997   [place]   right temporal craniotomy  2/97   basilarr artery aneurysm    FAMILY HISTORY: Family History  Problem Relation Age of Onset   Aortic aneurysm Mother 50   Hypertension Father    Heart disease Other    Cancer Brother    Prostate cancer Brother    Colon cancer Neg Hx    Colon polyps Neg Hx    Esophageal cancer Neg Hx    Rectal cancer Neg Hx    Stomach cancer Neg Hx     SOCIAL HISTORY: Social History   Socioeconomic History   Marital status: Single    Spouse name: Not on file   Number of children: 2   Years of education: 12   Highest education level: Not on file  Occupational History   Occupation: Programmer, 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  Substance and Sexual Activity   Alcohol use: Yes    Alcohol/week: 0.0 standard drinks of alcohol    Comment: Drinks 12 beers per year   Drug use: No   Sexual activity: Not on file  Other Topics Concern   Not on file  Social History Narrative   Lives alone   Patient is left handed   Caffeine consumption is 4 cups daily   Social Determinants of Health   Financial Resource Strain: Not on file  Food Insecurity: Not on file  Transportation Needs: Not on file  Physical Activity: Not on file  Stress: Not on file  Social Connections: Not on file  Intimate Partner Violence: Not on file    PHYSICAL EXAM  Vitals:   09/16/22 1044  BP: (!) 155/84  Pulse: 72  Weight: 206 lb (93.4 kg)  Height: 6' (1.829 m)    Body mass index is 27.94  kg/m.  Generalized: Well developed, in no acute distress  Neurological examination  Mentation: Alert oriented to time, place, history taking. Follows all commands speech and language fluent Cranial nerve II-XII: Pupils were equal round reactive to light. Extraocular movements were full, right homonymous visual field deficit.  Facial sensation and strength were normal. Head turning and shoulder shrug  were normal  and symmetric. Motor: The motor testing reveals 5 over 5 strength of all 4 extremities. Good symmetric motor tone is noted throughout.  Sensory: Sensory testing is intact to soft touch on all 4 extremities. No evidence of extinction is noted.  Coordination: Cerebellar testing reveals good finger-nose-finger and heel-to-shin bilaterally.  Gait and station: Gait is normal.  Reflexes: Deep tendon reflexes are symmetric and normal bilaterally.   DIAGNOSTIC DATA (LABS, IMAGING, TESTING) - I reviewed patient records, labs, notes, testing and imaging myself where available.  Lab Results  Component Value Date   WBC 3.8 05/29/2019   HGB 14.8 05/29/2019   HCT 42.9 05/29/2019   MCV 98 (H) 05/29/2019   PLT 185 05/29/2019      Component Value Date/Time   NA 142 05/29/2019 0830   K 4.9 05/29/2019 0830   CL 103 05/29/2019 0830   CO2 24 05/29/2019 0830   GLUCOSE 96 05/29/2019 0830   GLUCOSE 96 12/17/2014 1329   BUN 27 05/29/2019 0830   CREATININE 1.49 (H) 05/29/2019 0830   CALCIUM 9.9 05/29/2019 0830   PROT 7.5 05/29/2019 0830   ALBUMIN 4.7 05/29/2019 0830   AST 14 05/29/2019 0830   ALT 17 05/29/2019 0830   ALKPHOS 113 05/29/2019 0830   BILITOT 0.2 05/29/2019 0830   GFRNONAA 50 (L) 05/29/2019 0830   GFRAA 58 (L) 05/29/2019 0830   Lab Results  Component Value Date   CHOL 184 12/18/2014   HDL 56 12/18/2014   LDLCALC 108 (H) 12/18/2014   TRIG 98 12/18/2014   CHOLHDL 3.3 12/18/2014   Lab Results  Component Value Date   HGBA1C 5.4 12/18/2014   No results found for:  "VITAMINB12" No results found for: "TSH"  ASSESSMENT AND PLAN 61 y.o. year old male  has a past medical history of Allergy, Anemia, Aneurysm of anterior cerebral artery, Arthritis, Cataract, Cervical radiculopathy, Headache(784.0), Hemianopsia, High blood pressure, migraine headaches, Malformation, Seizures ([place]), and Stroke ([place]) (10/2011). here with:  1.  History of seizures, well controlled 2.  History of occipital AVM, right homonymous visual field deficit  -He will continue taking Dilantin 300 mg daily, EEG was abnormal showing frequent right temporal sharps consistent with an area of seizure potential -PCP follows labs, recent Dilantin level was 12.2, will ask PCP to check vitamin D level at next lab draw -Last seizure was in 2001, he is very interested in decreasing or completely coming off Dilantin, since he has had such a long interval being seizure-free -Review of chart per Dr. Erling Cruz in 2014, history of partial complex and secondary generalized seizures in 1979, left occipital AVM hemorrhage in 1997 -Follow-up in 1 year or sooner if needed  Meds ordered this encounter  Medications   phenytoin (DILANTIN) 100 MG ER capsule    Sig: Take 3 capsules (300 mg total) by mouth daily.    Dispense:  270 capsule    Refill:  4    Please do not fill until patient requests. Thanks   Butler Denmark, AGNP-C, DNP 09/16/2022, 11:07 AM [facility] [address], [place], [place] [address] [phone]

## 2022-09-16 ENCOUNTER — Ambulatory Visit: Payer: 59 | Admitting: Neurology

## 2022-09-16 ENCOUNTER — Encounter: Payer: Self-pay | Admitting: Neurology

## 2022-09-16 VITALS — BP 155/84 | HR 72 | Ht 72.0 in | Wt 206.0 lb

## 2022-09-16 DIAGNOSIS — G40909 Epilepsy, unspecified, not intractable, without status epilepticus: Secondary | ICD-10-CM | POA: Diagnosis not present

## 2022-09-16 MED ORDER — PHENYTOIN SODIUM EXTENDED 100 MG PO CAPS: 300.0000 mg | ORAL_CAPSULE | Freq: Every day | ORAL | 4 refills | Status: DC

## 2022-09-16 NOTE — Patient Instructions (Signed)
Great to see you today Please continue your Dilantin  Next time you got to our primary care, ask them to check a Vitamin D level  See you back in 1 year

## 2023-06-09 ENCOUNTER — Telehealth: Payer: Self-pay | Admitting: Neurology

## 2023-06-09 MED ORDER — PHENYTOIN SODIUM EXTENDED 100 MG PO CAPS: 300.0000 mg | ORAL_CAPSULE | Freq: Every day | ORAL | 4 refills | Status: DC

## 2023-06-09 NOTE — Telephone Encounter (Signed)
Pt said [facility] informed will out of phenytoin (DILANTIN) 100 MG ER capsule indefinitely. [id number] .

## 2023-06-09 NOTE — Telephone Encounter (Signed)
Medication [facility]  [facility], [facility] tablets, 4 refills

## 2023-08-27 ENCOUNTER — Ambulatory Visit: Payer: 59 | Admitting: Gastroenterology

## 2023-08-27 ENCOUNTER — Encounter: Payer: Self-pay | Admitting: Gastroenterology

## 2023-08-27 VITALS — BP 128/68 | HR 88 | Ht 69.25 in | Wt 191.2 lb

## 2023-08-27 DIAGNOSIS — K635 Polyp of colon: Secondary | ICD-10-CM

## 2023-08-27 DIAGNOSIS — Z860101 Personal history of adenomatous and serrated colon polyps: Secondary | ICD-10-CM | POA: Diagnosis not present

## 2023-08-27 DIAGNOSIS — K625 Hemorrhage of anus and rectum: Secondary | ICD-10-CM

## 2023-08-27 DIAGNOSIS — K649 Unspecified hemorrhoids: Secondary | ICD-10-CM | POA: Diagnosis not present

## 2023-08-27 DIAGNOSIS — Z860100 Personal history of colon polyps, unspecified: Secondary | ICD-10-CM

## 2023-08-27 MED ORDER — NA SULFATE-K SULFATE-MG SULF 17.5-3.13-1.6 GM/177ML PO SOLN: 1.0000 | ORAL | 0 refills | Status: DC

## 2023-08-27 NOTE — Patient Instructions (Signed)
You have been scheduled for a colonoscopy. Please follow written instructions given to you at your visit today.   Please pick up your prep supplies at the pharmacy within the next 1-3 days.  If you use inhalers (even only as needed), please bring them with you on the day of your procedure.  DO NOT TAKE 7 DAYS PRIOR TO TEST- Trulicity (dulaglutide) Ozempic, Wegovy (semaglutide) Mounjaro (tirzepatide) Bydureon Bcise (exanatide extended release)  DO NOT TAKE 1 DAY PRIOR TO YOUR TEST Rybelsus (semaglutide) Adlyxin (lixisenatide) Victoza (liraglutide) Byetta (exanatide) ___________________________________________________________________________  We have sent the following medications to your pharmacy for you to pick up at your convenience: Suprep   Due to recent changes in healthcare laws, you may see the results of your imaging and laboratory studies on MyChart before your provider has had a chance to review them.  We understand that in some cases there may be results that are confusing or concerning to you. Not all laboratory results come back in the same time frame and the provider may be waiting for multiple results in order to interpret others.  Please give [place] 48 hours in order for your provider to thoroughly review all the results before contacting the office for clarification of your results.   Thank you for choosing me and [facility].  Dr. Meridee Score

## 2023-08-27 NOTE — Progress Notes (Unsigned)
GASTROENTEROLOGY OUTPATIENT CLINIC VISIT   Primary Care Provider Geoffry Paradise, MD [address] [place] [address] [phone]  Referring Provider Olevia Perches, NP [address] [place],  [place] [address] [phone]  Patient Profile: Eric Craig is a 62 y.o. male with a pmh significant for  The patient presents to the [facility] for an evaluation and management of problem(s) noted below:  Problem List No diagnosis found.  History of Present Illness    The patient does/does not take NSAIDs or BC/Goody Powder. Patient has/has not had an EGD. Patient has/has not had a Colonoscopy.  GI Review of Systems Positive as above Negative for  Pyrosis; Reflux; Regurgitation; Dysphagia; Odynophagia; Globus; Post-prandial cough; Nocturnal cough; Nasal regurgitation; Epigastric pain; Nausea; Vomiting; Hematemesis; Jaundice; Change in Appetite; Early satiety; Abdominal pain; Abdominal bloating; Eructation; Flatulence; Change in BM Frequency; Change in BM Consistency; Constipation; Diarrhea; Incontinence; Urgency; Tenesmus; Hematochezia; Melena  Review of Systems General: Denies fevers/chills/weight loss/night sweats HEENT: Denies oral lesions/sore throat/headaches/visual changes Cardiovascular: Denies chest pain/palpitations Pulmonary: Denies shortness of breath/cough Gastroenterological: See HPI Genitourinary: Denies darkened urine or hematuria Hematological: Denies easy bruising/bleeding Endocrine: Denies temperature intolerance Dermatological: Denies skin changes Psychological: Mood is stable Allergy & Immunology: Denies severe allergic reactions Musculoskeletal: Denies new arthralgias   Medications Current Outpatient Medications  Medication Sig Dispense Refill   amLODipine (NORVASC) 10 MG tablet Take 10 mg by mouth daily.     calcium carbonate (OS-CAL) 600 MG TABS Take 600 mg by mouth daily.     fluticasone (FLONASE) 50 MCG/ACT nasal  spray Place 1 spray into both nostrils as needed.     Multiple Vitamin (MULTIVITAMIN) capsule Take 1 capsule by mouth daily. am     olmesartan-hydrochlorothiazide (BENICAR HCT) 40-25 MG tablet Take 1 tablet by mouth daily.     phenytoin (DILANTIN) 100 MG ER capsule Take 3 capsules (300 mg total) by mouth daily. 90 capsule 4   rosuvastatin (CRESTOR) 10 MG tablet Take 10 mg by mouth at bedtime.     No current facility-administered medications for this visit.    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 C6-7 repair in 1993 and 2001. Residual left arm weakness   removal of the arteriovenous malformation  1997   [facility]  right temporal craniotomy  2/97   basilarr artery aneurysm   Social History   Socioeconomic History   Marital status: Single    Spouse name: Not on file   Number of children: 2   Years of education: 12   Highest education level: Not on file  Occupational History   Occupation: Architect: Pension scheme manager    Comment: retired  Tobacco Use   Smoking status: Former    Current packs/day: 1.00    Average packs/day: 1 pack/day for 40.0 years (40.0 ttl pk-yrs)    Types: Cigarettes   Smokeless tobacco: Never   Tobacco comments:    Quit three years ago.  Substance and Sexual Activity   Alcohol use: Yes    Alcohol/week: 0.0 standard drinks of alcohol    Comment: Drinks 12 beers per year   Drug use: No   Sexual activity: Not on file  Other Topics Concern   Not on file  Social History Narrative   Lives alone   Patient is left handed   Caffeine consumption is 4 cups daily   Social Determinants of Health   Financial Resource Strain: Not on file  Food Insecurity: Not on file  Transportation Needs: Not on file  Physical Activity: Not on file  Stress: Not on file  Social Connections: Not on file  Intimate Partner Violence: Not on file   Family History  Problem Relation Age of Onset   Aortic aneurysm Mother 39   Hypertension Father    Heart disease Other    Cancer Brother    Prostate cancer Brother    Colon cancer Neg Hx    Colon polyps Neg Hx    Esophageal cancer Neg Hx    Rectal cancer Neg Hx    Stomach cancer Neg Hx    I have reviewed his medical, social, and family history in detail and updated the electronic medical record as necessary.    PHYSICAL EXAMINATION  BP 128/68 (BP Location: Left Arm, Patient Position: Sitting, Cuff Size: Normal)   Pulse 88   Ht 5' 9.25" (1.759 m) Comment: height measured without shoes  Wt 191 lb 4 oz (86.8 kg)   BMI 28.04 kg/m  Wt Readings from Last 3 Encounters:  08/27/23 191 lb 4 oz (86.8 kg)  09/16/22 206 lb (93.4 kg)  09/16/21 207 lb (93.9 kg)   GEN: NAD, appears stated age, doesn't appear chronically ill PSYCH: Cooperative, without pressured speech EYE: Conjunctivae pink, sclerae anicteric ENT: MMM NECK: Supple CV: Nontachycardic RESP: No audible wheezing GI: NABS, soft, NT/ND, without rebound or guarding, no HSM  appreciated GU: DRE shows MSK/EXT: _ edema, no palmar erythema SKIN: No jaundice, no spider angiomata, no concerning rashes NEURO:  Alert & Oriented x 3, no focal deficits, no evidence of asterixis   REVIEW OF DATA  I reviewed the following data at the time of this encounter:  GI Procedures and Studies  ***  Laboratory Studies  ***  Imaging Studies  ***   ASSESSMENT  Mr. Blankley is a 62 y.o. male with a pmh significant for The patient is seen today for evaluation and management of:  No diagnosis found.  ***   PLAN  There are no diagnoses linked to this encounter.   No orders of the defined types were placed in this encounter.   New Prescriptions   No medications on file   Modified Medications   No medications on file    Planned Follow Up No  follow-ups on file.   Total Time in Face-to-Face and in Coordination of Care for patient including independent/personal interpretation/review of prior testing, medical history, examination, medication adjustment, communicating results with the patient directly, and documentation within the EHR is ***.   Corliss Parish, MD [facility] Office # [phone]

## 2023-08-29 ENCOUNTER — Encounter: Payer: Self-pay | Admitting: Gastroenterology

## 2023-08-29 DIAGNOSIS — K625 Hemorrhage of anus and rectum: Secondary | ICD-10-CM | POA: Insufficient documentation

## 2023-08-29 DIAGNOSIS — K649 Unspecified hemorrhoids: Secondary | ICD-10-CM | POA: Insufficient documentation

## 2023-08-29 DIAGNOSIS — K635 Polyp of colon: Secondary | ICD-10-CM | POA: Insufficient documentation

## 2023-08-29 DIAGNOSIS — Z860101 Personal history of adenomatous and serrated colon polyps: Secondary | ICD-10-CM | POA: Insufficient documentation

## 2023-09-21 NOTE — Progress Notes (Unsigned)
PATIENT: Eric Craig DOB: [birth year]  REASON FOR VISIT: follow up HISTORY FROM: patient Primary Neurologist: Dr. Anne Hahn, will be followed by Dr. Teresa Coombs   HISTORY OF PRESENT ILLNESS: Today 09/21/23   09/16/22 SS: He had an abnormal EEG in December 2022 showing frequent right temporal sharps consistent with an area epileptogenic potential and right temporal slowing.  Given abnormal EEG we would not recommend coming off seizure medication.  He has remained on Dilantin 300 mg daily in the AM, denies side effects. No seizures. He is now on rosuvastatin.  Has labs done with PCP in October Dilantin level was 12.2.  Update 09/16/21 SS: Eric Craig is a 62 year old male with history of seizures. 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  His last seizure was in 2001.  After last visit, he was switched from brand-name Dilantin to generic.  He has done well with this.  He is a Sport and exercise psychologist.  He drives a car.  Had blood work done at PCP, reports was unremarkable, Dilantin level was reported around 8.  He mentions the idea of decreasing or completely coming off the Dilantin since it has been so long since his last seizure.  After probing, reports the Dilantin has resulted in some sexual side effects.  Claims his last seizures occurred when he was heavily drinking alcohol, he now rarely drinks.  Here today for evaluation unaccompanied.  HISTORY  09/19/2020 Dr. Anne Hahn: Eric Craig is a 62 year old left-handed white male with a history of seizures. 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 The patient has done well with his seizures, the last seizure was in 2001. The patient remains on brand name Dilantin. He recently had a blood level done through his primary care physician of 13.6. The patient tolerates the drug well. He  wishes to switch to a generic medication to save money. He returns to this office for an evaluation. No new medical issues have come up since last seen, the patient has had cataract surgery.  REVIEW OF SYSTEMS: Out of a complete 14 system review of symptoms, the patient complains only of the following symptoms, and all other reviewed systems are negative.  See HPI  ALLERGIES: No Known Allergies  HOME MEDICATIONS: Outpatient Medications Prior to Visit  Medication Sig Dispense Refill   amLODipine (NORVASC) 10 MG tablet Take 10 mg by mouth daily.     calcium carbonate (OS-CAL) 600 MG TABS Take 600 mg by mouth daily.     fluticasone (FLONASE) 50 MCG/ACT nasal spray Place 1 spray into both nostrils as needed.     Multiple Vitamin (MULTIVITAMIN) capsule Take 1 capsule by mouth daily. am     Na Sulfate-K Sulfate-Mg Sulf (SUPREP BOWEL PREP KIT) 17.5-3.13-1.6 GM/177ML SOLN Take 1 kit by mouth as directed. For colonoscopy prep 354 mL 0   olmesartan-hydrochlorothiazide (BENICAR HCT) 40-25 MG tablet Take 1 tablet by mouth daily.     phenytoin (DILANTIN) 100 MG ER capsule Take 3 capsules (300 mg total) by mouth daily. 90 capsule 4   rosuvastatin (CRESTOR) 10 MG tablet Take 10 mg by mouth at bedtime.     No facility-administered medications prior to visit.    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   Arthritis    right thumb    Cataract    removed both eyes with lens implants    Cervical radiculopathy    recurrent   Headache(784.0)    Hemianopsia    residual right homoinmous   High blood pressure    Hx of migraine headaches    Malformation    occipital arteriovenous   Seizures (HCC)    Last seizure was 2001   Stroke (HCC) 10/2011   brainstem, not seen on MRI    PAST SURGICAL HISTORY: Past Surgical History:  Procedure Laterality Date    BRAIN SURGERY     Basilar artery aneurysm repair in 1997, with facial reconstruction in 1998   C5-C6 laminectomy  1/89   cataract Bilateral 06/2018   CERVICAL DISCECTOMY  6/89   C5-C6-7:04/1992, C4-5, w/ plate fusion for left C5 radiculopathy 12/00   Invasive loop procedure  1997   [place] for treatment of the AVM    NECK SURGERY     C4-5 repair in 1989. C6-7 repair in 1993 and 2001. Residual left arm weakness   removal of the arteriovenous malformation  1997   [place]   right temporal craniotomy  2/97   basilarr artery aneurysm    FAMILY HISTORY: Family History  Problem Relation Age of Onset   Aortic aneurysm Mother 72   Hypertension Father    Cancer Brother    Prostate cancer Brother    Heart disease Other    Colon cancer Neg Hx    Colon polyps Neg Hx    Esophageal cancer Neg Hx    Rectal cancer Neg Hx    Stomach cancer Neg Hx    Inflammatory bowel disease Neg Hx    Liver disease Neg Hx    Pancreatic cancer Neg Hx     SOCIAL HISTORY: Social History   Socioeconomic History   Marital status: Single    Spouse name: Not on file   Number of children: 2   Years of education: 12   Highest education level: Not on file  Occupational History   Occupation: Architect: Advice worker    Comment: retired  Tobacco Use   Smoking status: Former    Current packs/day: 1.00    Average packs/day: 1 pack/day for 40.0 years (40.0 ttl pk-yrs)    Types: Cigarettes   Smokeless tobacco: Never   Tobacco comments:    Quit three years ago.  Substance and Sexual Activity   Alcohol use: Yes    Alcohol/week: 0.0 standard drinks of alcohol    Comment: Drinks 12 beers per year   Drug use: No   Sexual activity: Not on file  Other Topics Concern   Not on file  Social History Narrative   Lives alone   Patient is left handed   Caffeine consumption is 4 cups daily   Social Determinants of Health   Financial Resource Strain: Not on file  Food Insecurity: Not on  file  Transportation Needs: Not on file  Physical Activity: Not on file  Stress: Not on file  Social Connections: Not on file  Intimate Partner Violence: Not on file    PHYSICAL EXAM  There were no vitals filed for this visit.   There is no height or weight on file to calculate BMI.  Generalized: Well developed, in no acute distress  Neurological examination  Mentation: Alert oriented to time,  place, history taking. Follows all commands speech and language fluent Cranial nerve II-XII: Pupils were equal round reactive to light. Extraocular movements were full, right homonymous visual field deficit.  Facial sensation and strength were normal. Head turning and shoulder shrug  were normal and symmetric. Motor: The motor testing reveals 5 over 5 strength of all 4 extremities. Good symmetric motor tone is noted throughout.  Sensory: Sensory testing is intact to soft touch on all 4 extremities. No evidence of extinction is noted.  Coordination: Cerebellar testing reveals good finger-nose-finger and heel-to-shin bilaterally.  Gait and station: Gait is normal.  Reflexes: Deep tendon reflexes are symmetric and normal bilaterally.   DIAGNOSTIC DATA (LABS, IMAGING, TESTING) - I reviewed patient records, labs, notes, testing and imaging myself where available.  Lab Results  Component Value Date   WBC 3.8 05/29/2019   HGB 14.8 05/29/2019   HCT 42.9 05/29/2019   MCV 98 (H) 05/29/2019   PLT 185 05/29/2019      Component Value Date/Time   NA 142 05/29/2019 0830   K 4.9 05/29/2019 0830   CL 103 05/29/2019 0830   CO2 24 05/29/2019 0830   GLUCOSE 96 05/29/2019 0830   GLUCOSE 96 12/17/2014 1329   BUN 27 05/29/2019 0830   CREATININE 1.49 (H) 05/29/2019 0830   CALCIUM 9.9 05/29/2019 0830   PROT 7.5 05/29/2019 0830   ALBUMIN 4.7 05/29/2019 0830   AST 14 05/29/2019 0830   ALT 17 05/29/2019 0830   ALKPHOS 113 05/29/2019 0830   BILITOT 0.2 05/29/2019 0830   GFRNONAA 50 (L) 05/29/2019 0830    GFRAA 58 (L) 05/29/2019 0830   Lab Results  Component Value Date   CHOL 184 12/18/2014   HDL 56 12/18/2014   LDLCALC 108 (H) 12/18/2014   TRIG 98 12/18/2014   CHOLHDL 3.3 12/18/2014   Lab Results  Component Value Date   HGBA1C 5.4 12/18/2014   No results found for: "VITAMINB12" No results found for: "TSH"  ASSESSMENT AND PLAN 62 y.o. year old male  has a past medical history of Allergy, Anemia, Aneurysm of anterior cerebral artery, Arthritis, Cataract, Cervical radiculopathy, Headache(784.0), Hemianopsia, High blood pressure, migraine headaches, Malformation, Seizures (HCC), and Stroke (HCC) (10/2011). here with:  1.  History of seizures, well controlled 2.  History of occipital AVM, right homonymous visual field deficit  -He will continue taking Dilantin 300 mg daily, EEG was abnormal showing frequent right temporal sharps consistent with an area of seizure potential -PCP follows labs, recent Dilantin level was 12.2, will ask PCP to check vitamin D level at next lab draw -Last seizure was in 2001, he is very interested in decreasing or completely coming off Dilantin, since he has had such a long interval being seizure-free -Review of chart per Dr. Sandria Manly in 2014, history of partial complex and secondary generalized seizures in 1979, left occipital AVM hemorrhage in 1997 -Follow-up in 1 year or sooner if needed  No orders of the defined types were placed in this encounter.  Margie Ege, AGNP-C, DNP 09/21/2023, 10:12 AM [facility] [address] [place], [place] [address] [phone]

## 2023-09-22 ENCOUNTER — Ambulatory Visit: Payer: 59 | Admitting: Neurology

## 2023-09-22 ENCOUNTER — Encounter: Payer: Self-pay | Admitting: Neurology

## 2023-09-22 VITALS — BP 140/89 | HR 77 | Ht 71.0 in | Wt 196.0 lb

## 2023-09-22 DIAGNOSIS — G40909 Epilepsy, unspecified, not intractable, without status epilepticus: Secondary | ICD-10-CM

## 2023-09-22 MED ORDER — PHENYTOIN SODIUM EXTENDED 100 MG PO CAPS: 300.0000 mg | ORAL_CAPSULE | Freq: Every day | ORAL | 4 refills | Status: DC

## 2023-09-22 NOTE — Patient Instructions (Signed)
I will request labs from your primary care doctor.  Continue Dilantin.  A refill was sent in.  Call for any seizures.  Will follow-up in 1 year.  Thanks!!

## 2023-11-26 ENCOUNTER — Telehealth: Payer: Self-pay | Admitting: Gastroenterology
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
## 2023-11-30 ENCOUNTER — Encounter: Payer: Self-pay | Admitting: Certified Registered Nurse Anesthetist

## 2023-12-03 ENCOUNTER — Ambulatory Visit: Payer: Medicare Other | Admitting: Gastroenterology

## 2023-12-03 ENCOUNTER — Encounter: Payer: Self-pay | Admitting: Gastroenterology

## 2023-12-03 VITALS — BP 134/83 | HR 69 | Temp 97.2°F | Resp 13 | Ht 69.0 in | Wt 191.0 lb

## 2023-12-03 DIAGNOSIS — Z860101 Personal history of adenomatous and serrated colon polyps: Secondary | ICD-10-CM

## 2023-12-03 DIAGNOSIS — K635 Polyp of colon: Secondary | ICD-10-CM

## 2023-12-03 DIAGNOSIS — D125 Benign neoplasm of sigmoid colon: Secondary | ICD-10-CM

## 2023-12-03 DIAGNOSIS — K621 Rectal polyp: Secondary | ICD-10-CM

## 2023-12-03 DIAGNOSIS — K625 Hemorrhage of anus and rectum: Secondary | ICD-10-CM

## 2023-12-03 DIAGNOSIS — K641 Second degree hemorrhoids: Secondary | ICD-10-CM

## 2023-12-03 DIAGNOSIS — D128 Benign neoplasm of rectum: Secondary | ICD-10-CM

## 2023-12-03 DIAGNOSIS — D123 Benign neoplasm of transverse colon: Secondary | ICD-10-CM

## 2023-12-03 DIAGNOSIS — Z1211 Encounter for screening for malignant neoplasm of colon: Secondary | ICD-10-CM | POA: Diagnosis not present

## 2023-12-03 DIAGNOSIS — K5730 Diverticulosis of large intestine without perforation or abscess without bleeding: Secondary | ICD-10-CM

## 2023-12-03 MED ORDER — SODIUM CHLORIDE 0.9 % IV SOLN: 500.0000 mL | Freq: Once | INTRAVENOUS | Status: DC

## 2023-12-03 MED ORDER — HYDROCORTISONE ACETATE 25 MG RE SUPP: 25.0000 mg | Freq: Every day | RECTAL | 1 refills | Status: DC

## 2023-12-03 NOTE — Op Note (Signed)
[facility] Patient Name: Eric Craig Procedure Date: 12/03/2023 7:15 AM MRN: [id number] Endoscopist: Corliss Parish , MD, [id number] Age: 63 Referring MD:  Date of Birth: [birth year] Gender: Male Account #: [id number] Procedure:                Colonoscopy Indications:              Surveillance: Personal history of adenomatous                            polyps on last colonoscopy > 3 years ago, High risk                            colon cancer surveillance: Personal history of                            sessile serrated colon polyp (10 mm or greater in                            size) Medicines:                Monitored Anesthesia Care Procedure:                Pre-Anesthesia Assessment:                           - Prior to the procedure, a History and Physical                            was performed, and patient medications and                            allergies were reviewed. The patient's tolerance of                            previous anesthesia was also reviewed. The risks                            and benefits of the procedure and the sedation                            options and risks were discussed with the patient.                            All questions were answered, and informed consent                            was obtained. Prior Anticoagulants: The patient has                            taken no anticoagulant or antiplatelet agents. ASA                            Grade Assessment: III - A patient with severe  systemic disease. After reviewing the risks and                            benefits, the patient was deemed in satisfactory                            condition to undergo the procedure.                           After obtaining informed consent, the colonoscope                            was passed under direct vision. Throughout the                            procedure, the patient's blood pressure, pulse, and                             oxygen saturations were monitored continuously. The                            Olympus Scope SN: [id number] was introduced through                            the anus and advanced to the 3 cm into the ileum.                            The colonoscopy was performed without difficulty.                            The patient tolerated the procedure. The quality of                            the bowel preparation was adequate. The terminal                            ileum, ileocecal valve, appendiceal orifice, and                            rectum were photographed. Scope In: 8:02:31 AM Scope Out: 8:19:49 AM Scope Withdrawal Time: 0 hours 13 minutes 56 seconds  Total Procedure Duration: 0 hours 17 minutes 18 seconds  Findings:                 The digital rectal exam findings include                            hemorrhoids. Pertinent negatives include no                            palpable rectal lesions.                           The terminal ileum and ileocecal valve appeared  normal.                           Four sessile polyps were found in the rectum (2),                            sigmoid colon (1) and transverse colon (1). The                            polyps were 3 to 5 mm in size. These polyps were                            removed with a cold snare. Resection and retrieval                            were complete.                           Multiple small-mouthed diverticula were found in                            the recto-sigmoid colon, sigmoid colon and                            descending colon.                           Normal mucosa was found in the entire colon                            otherwise.                           Non-bleeding non-thrombosed external and internal                            hemorrhoids were found during retroflexion, during                            perianal exam and during digital exam. The                             hemorrhoids were Grade II (internal hemorrhoids                            that prolapse but reduce spontaneously). Complications:            No immediate complications. Estimated Blood Loss:     Estimated blood loss: none. Estimated blood loss                            was minimal. Impression:               - Hemorrhoids found on digital rectal exam.                           -  The examined portion of the ileum was normal.                           - Four 3 to 5 mm polyps in the rectum, in the                            sigmoid colon and in the transverse colon, removed                            with a cold snare. Resected and retrieved.                           - Diverticulosis in the recto-sigmoid colon, in the                            sigmoid colon and in the descending colon.                           - Normal mucosa in the entire examined colon                            otherwise.                           - Non-bleeding non-thrombosed external and internal                            hemorrhoids. Recommendation:           - The patient will be observed post-procedure,                            until all discharge criteria are met.                           - Discharge patient to home.                           - Patient has a contact number available for                            emergencies. The signs and symptoms of potential                            delayed complications were discussed with the                            patient. Return to normal activities tomorrow.                            Written discharge instructions were provided to the                            patient.                           -  High fiber diet.                           - Use FiberCon 1-2 tablets PO daily.                           - Continue present medications.                           - Await pathology results.                           - Repeat colonoscopy in 3 - 5  years for                            surveillance based on pathology results and history                            of previous advanced adenoma.                           - Anusol suppositories nightly x 1 week and then                            every other night until prescription is done (1                            refill).                           - If patient would like an opportunity for internal                            hemorrhoidal banding prior to colorectal surgery                            referral hemorrhoidectomy, can offer that to                            patient desires.                           - The findings and recommendations were discussed                            with the patient.                           - The findings and recommendations were discussed                            with the designated responsible adult. Corliss Parish, MD 12/03/2023 8:26:57 AM

## 2023-12-03 NOTE — Progress Notes (Signed)
GASTROENTEROLOGY PROCEDURE H&P NOTE   Primary Care Physician: Geoffry Paradise, MD  HPI: Eric Craig is a 63 y.o. male who presents for Colonoscopy for surveillance of previous adenomas and SSPs and advanced adenoma.  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 C6-7 repair in 1993 and 2001. Residual left arm weakness   removal of the arteriovenous malformation  1997   [place]   right temporal craniotomy  2/97   basilarr artery aneurysm   Current Outpatient Medications  Medication Sig Dispense Refill   amLODipine (NORVASC) 10 MG tablet Take 10 mg by mouth daily.     calcium carbonate (OS-CAL) 600 MG TABS Take 600 mg by mouth daily.     losartan-hydrochlorothiazide (HYZAAR) 100-25 MG tablet SMARTSIG:1.0 Tablet(s) By Mouth Daily     Multiple Vitamin (MULTIVITAMIN) capsule Take 1 capsule by mouth daily. am     olmesartan-hydrochlorothiazide (BENICAR HCT) 40-25 MG tablet Take 1 tablet by mouth daily.     phenytoin (DILANTIN) 100 MG ER capsule Take 3 capsules (300 mg total)  by mouth daily. 90 capsule 4   rosuvastatin (CRESTOR) 10 MG tablet Take 10 mg by mouth at bedtime.     Current Facility-Administered Medications  Medication Dose Route Frequency Provider Last Rate Last Admin   0.9 %  sodium chloride infusion  500 mL Intravenous Once Mansouraty, Netty Starring., MD        Current Outpatient Medications:    amLODipine (NORVASC) 10 MG tablet, Take 10 mg by mouth daily., Disp: , Rfl:    calcium carbonate (OS-CAL) 600 MG TABS, Take 600 mg by mouth daily., Disp: , Rfl:    losartan-hydrochlorothiazide (HYZAAR) 100-25 MG tablet, SMARTSIG:1.0 Tablet(s) By Mouth Daily, Disp: , Rfl:    Multiple Vitamin (MULTIVITAMIN) capsule, Take 1 capsule by mouth daily. am, Disp: , Rfl:    olmesartan-hydrochlorothiazide (BENICAR HCT) 40-25 MG tablet, Take 1 tablet by mouth daily., Disp: , Rfl:    phenytoin (DILANTIN) 100 MG ER capsule, Take 3 capsules (300 mg total) by mouth daily., Disp: 90 capsule, Rfl: 4   rosuvastatin (CRESTOR) 10 MG tablet, Take 10 mg by mouth at bedtime., Disp: , Rfl:   Current Facility-Administered Medications:    0.9 %  sodium chloride infusion, 500 mL, Intravenous, Once, Mansouraty, Netty Starring., MD No Known Allergies Family  History  Problem Relation Age of Onset   Aortic aneurysm Mother 27   Hypertension Father    Cancer Brother    Prostate cancer Brother    Heart disease Other    Colon cancer Neg Hx    Colon polyps Neg Hx    Esophageal cancer Neg Hx    Rectal cancer Neg Hx    Stomach cancer Neg Hx    Inflammatory bowel disease Neg Hx    Liver disease Neg Hx    Pancreatic cancer Neg Hx    Social History   Socioeconomic History   Marital status: Single    Spouse name: Not on file   Number of children: 2   Years of education: 12   Highest education level: Not on file  Occupational History   Occupation: Architect: Advice worker    Comment: retired  Tobacco Use   Smoking status: Former    Current packs/day: 1.00    Average  packs/day: 1 pack/day for 40.0 years (40.0 ttl pk-yrs)    Types: Cigarettes   Smokeless tobacco: Never   Tobacco comments:    Quit three years ago.  Substance and Sexual Activity   Alcohol use: Yes    Alcohol/week: 0.0 standard drinks of alcohol    Comment: Drinks 12 beers per year   Drug use: No   Sexual activity: Not on file  Other Topics Concern   Not on file  Social History Narrative   Lives alone   Patient is left handed   Caffeine consumption is 4 cups daily   Social Drivers of Health   Financial Resource Strain: Not on file  Food Insecurity: Not on file  Transportation Needs: Not on file  Physical Activity: Not on file  Stress: Not on file  Social Connections: Not on file  Intimate Partner Violence: Not on file    Physical Exam: Today's Vitals   12/03/23 0709  BP: (!) 148/89  Pulse: 80  Temp: (!) 97.2 F (36.2 C)  SpO2: 96%  Weight: 191 lb (86.6 kg)  Height: 5\' 9"  (1.753 m)   Body mass index is 28.21 kg/m. GEN: NAD EYE: Sclerae anicteric ENT: MMM CV: Non-tachycardic GI: Soft, NT/ND NEURO:  Alert & Oriented x 3  Lab Results: No results for input(s): "WBC", "HGB", "HCT", "PLT" in the last 72 hours. BMET No results for input(s): "NA", "K", "CL", "CO2", "GLUCOSE", "BUN", "CREATININE", "CALCIUM" in the last 72 hours. LFT No results for input(s): "PROT", "ALBUMIN", "AST", "ALT", "ALKPHOS", "BILITOT", "BILIDIR", "IBILI" in the last 72 hours. PT/INR No results for input(s): "LABPROT", "INR" in the last 72 hours.   Impression / Plan: This is a 63 y.o.male who presents for Colonoscopy for surveillance of previous adenomas and SSPs and advanced adenoma.  The risks and benefits of endoscopic evaluation/treatment were discussed with the patient and/or family; these include but are not limited to the risk of perforation, infection, bleeding, missed lesions, lack of diagnosis, severe illness requiring hospitalization, as well as anesthesia and sedation related  illnesses.  The patient's history has been reviewed, patient examined, no change in status, and deemed stable for procedure.  The patient and/or family is agreeable to proceed.    Corliss Parish, MD [facility] Advanced Endoscopy Office # [phone]

## 2023-12-03 NOTE — Progress Notes (Signed)
 Report given to PACU, vss

## 2023-12-03 NOTE — Patient Instructions (Addendum)
Handouts provided about hemorrhoids, high fiber diet, banding, diverticulosis and polyps.    Use FiberCon 1-2 tablets PO daily.  Continue present medications.  Anusol suppositories nightly X 1 week and then every other night until prescription is done.  Repeat colonoscopy in 3-5 years for surveillance based on pathology results and history of previous advanced adenoma.   Await pathology results.  If patient would like an opportunity for internal hemorrhoidal banding prior to colorectal surgery referral hemorrhoidectomy, can offer that to patient if desires.   YOU HAD AN ENDOSCOPIC PROCEDURE TODAY AT THE [facility]:   Refer to the procedure report that was given to you for any specific questions about what was found during the examination.  If the procedure report does not answer your questions, please call your gastroenterologist to clarify.  If you requested that your care partner not be given the details of your procedure findings, then the procedure report has been included in a sealed envelope for you to review at your convenience later.  YOU SHOULD EXPECT: Some feelings of bloating in the abdomen. Passage of more gas than usual.  Walking can help get rid of the air that was put into your GI tract during the procedure and reduce the bloating. If you had a lower endoscopy (such as a colonoscopy or flexible sigmoidoscopy) you may notice spotting of blood in your stool or on the toilet paper. If you underwent a bowel prep for your procedure, you may not have a normal bowel movement for a few days.  Please Note:  You might notice some irritation and congestion in your nose or some drainage.  This is from the oxygen used during your procedure.  There is no need for concern and it should clear up in a day or so.  SYMPTOMS TO REPORT IMMEDIATELY:  Following lower endoscopy (colonoscopy or flexible sigmoidoscopy):  Excessive amounts of blood in the stool  Significant tenderness or  worsening of abdominal pains  Swelling of the abdomen that is new, acute  Fever of 100F or higher  For urgent or emergent issues, a gastroenterologist can be reached at any hour by calling [phone]. Do not use MyChart messaging for urgent concerns.    DIET:  We do recommend a small meal at first, but then you may proceed to your regular diet.  Drink plenty of fluids but you should avoid alcoholic beverages for 24 hours.  ACTIVITY:  You should plan to take it easy for the rest of today and you should NOT DRIVE or use heavy machinery until tomorrow (because of the sedation medicines used during the test).    FOLLOW UP: Our staff will call the number listed on your records the next business day following your procedure.  We will call around 7:15- 8:00 am to check on you and address any questions or concerns that you may have regarding the information given to you following your procedure. If we do not reach you, we will leave a message.     If any biopsies were taken you will be contacted by phone or by letter within the next 1-3 weeks.  Please call us at [phone] if you have not heard about the biopsies in 3 weeks.    SIGNATURES/CONFIDENTIALITY: You and/or your care partner have signed paperwork which will be entered into your electronic medical record.  These signatures attest to the fact that that the information above on your After Visit Summary has been reviewed and is understood.  Full  responsibility of the confidentiality of this discharge information lies with you and/or your care-partner.

## 2023-12-03 NOTE — Progress Notes (Signed)
 Called to room to assist during endoscopic procedure.  Patient ID and intended procedure confirmed with present staff. Received instructions for my participation in the procedure from the performing physician.

## 2023-12-06 ENCOUNTER — Telehealth: Payer: Self-pay

## 2023-12-06 NOTE — Telephone Encounter (Signed)
 Left message on answering machine.

## 2023-12-07 ENCOUNTER — Encounter: Payer: Self-pay | Admitting: Gastroenterology

## 2023-12-07 LAB — SURGICAL PATHOLOGY

## 2024-02-15 ENCOUNTER — Other Ambulatory Visit: Payer: Self-pay | Admitting: Physical Medicine and Rehabilitation

## 2024-02-15 DIAGNOSIS — M5416 Radiculopathy, lumbar region: Secondary | ICD-10-CM

## 2024-02-18 NOTE — Discharge Instructions (Signed)

## 2024-02-22 ENCOUNTER — Ambulatory Visit
Admission: RE | Admit: 2024-02-22 | Discharge: 2024-02-22 | Disposition: A | Discharge status: Home / Self Care | Source: Ambulatory Visit | Attending: Physical Medicine and Rehabilitation | Admitting: Physical Medicine and Rehabilitation

## 2024-02-22 DIAGNOSIS — M5416 Radiculopathy, lumbar region: Secondary | ICD-10-CM

## 2024-02-22 MED ORDER — DIAZEPAM 5 MG PO TABS
5.0000 mg | ORAL_TABLET | Freq: Once | ORAL | Status: AC
  Administered 2024-02-22: 5 mg via ORAL

## 2024-02-22 MED ORDER — MEPERIDINE HCL 50 MG/ML IJ SOLN: 50.0000 mg | Freq: Once | INTRAMUSCULAR | Status: DC | PRN

## 2024-02-22 MED ORDER — IOPAMIDOL (ISOVUE-M 200) INJECTION 41%
20.0000 mL | Freq: Once | INTRAMUSCULAR | Status: AC
  Administered 2024-02-22: 20 mL via INTRATHECAL

## 2024-02-22 MED ORDER — ONDANSETRON HCL 4 MG/2ML IJ SOLN: 4.0000 mg | Freq: Once | INTRAMUSCULAR | Status: DC | PRN

## 2024-03-21 ENCOUNTER — Telehealth: Payer: Self-pay | Admitting: Neurology

## 2024-03-21 NOTE — Telephone Encounter (Signed)
 Pt called stating that he just received his phenytoin  (DILANTIN ) 100 MG ER capsule and they are not sending the qt that they are supposed to be sending. Pt would like to speak to a nurse to make sure the Rx is written correctly. Please advise.Aaron Aas

## 2024-03-21 NOTE — Telephone Encounter (Signed)
 Spoke w/Pt who stated whomever did the last refill put in for an incorrect quantity that he gets a 90 day supply from [facility] and this refill was only 180 and he does not understand why. Informed Pt we will reach out to [facility] regarding the script and who provided last refill. Pt voiced understanding. Spoke w/pharmacist at [facility] who stated the way the script was sent in originally was for 90 day supply with 4 refills so the 180 qty the patient just received was the remaining refills. The refill dosage was correct it is just all that was left on his refills with [facility]. If a refill is sent in now electronically, it will be filed until time for the refill to be sent and billed to his insurance. Cld Pt back and explained to him regarding the quantity that was sent was the remaining quantity for the prescription written in December and that when his medication is refilled it will be for a 90 days supply so he should get the 270 quantity in the future. Pt voiced understanding and thankful for the call back and explanation.

## 2024-04-29 ENCOUNTER — Other Ambulatory Visit: Payer: Self-pay | Admitting: Neurology

## 2024-09-20 DIAGNOSIS — J329 Chronic sinusitis, unspecified: Secondary | ICD-10-CM | POA: Insufficient documentation

## 2024-09-20 DIAGNOSIS — N1831 Chronic kidney disease, stage 3a: Secondary | ICD-10-CM | POA: Insufficient documentation

## 2024-09-20 DIAGNOSIS — R1310 Dysphagia, unspecified: Secondary | ICD-10-CM | POA: Insufficient documentation

## 2024-09-21 ENCOUNTER — Encounter: Payer: Self-pay | Admitting: Neurology

## 2024-09-21 ENCOUNTER — Ambulatory Visit: Payer: 59 | Admitting: Neurology

## 2024-09-21 VITALS — BP 157/84 | HR 83 | Ht 69.0 in | Wt 202.0 lb

## 2024-09-21 DIAGNOSIS — Z8774 Personal history of (corrected) congenital malformations of heart and circulatory system: Secondary | ICD-10-CM

## 2024-09-21 DIAGNOSIS — G40909 Epilepsy, unspecified, not intractable, without status epilepticus: Secondary | ICD-10-CM

## 2024-09-21 DIAGNOSIS — H9313 Tinnitus, bilateral: Secondary | ICD-10-CM | POA: Insufficient documentation

## 2024-09-21 DIAGNOSIS — I700 Atherosclerosis of aorta: Secondary | ICD-10-CM | POA: Insufficient documentation

## 2024-09-21 NOTE — Progress Notes (Signed)
 PATIENT: Eric Craig DOB: Jan 15, 1961  REASON FOR VISIT: follow up HISTORY FROM: patient Primary Neurologist: Dr. Jenel, will be followed by Dr. Gregg   ASSESSMENT AND PLAN 63 y.o. year old male  has a past medical history of Allergy, Anemia, Aneurysm of anterior cerebral artery, Arthritis, Cataract, Cervical radiculopathy, Headache(784.0), Hemianopsia, High blood pressure, migraine headaches, Malformation, Seizures (HCC), and Stroke (HCC) (10/2011). here with:  1.  History of seizures, well controlled 2.  History of occipital AVM, right homonymous visual field deficit  -He will continue taking Dilantin  300 mg daily, EEG was abnormal showing frequent right temporal sharps consistent with an area of seizure potential -PCP follows labs, I will request the most recent labs -Last seizure was in 2001 -Review of chart per Dr. Maurice in 2014, history of partial complex and secondary generalized seizures in 1979, left occipital AVM hemorrhage in 1997 -Follow-up in 1 year or sooner if needed -Consider switching to Levetiracetam  in the future when patient is agreeable    HISTORY OF PRESENT ILLNESS: Today 09/21/2024 Patient presents today for follow-up, he is alone.  Last visit was a year ago, since then he has been doing well, denies any seizure or seizure like activity.  Tells me that his last seizure was in 2001.  He is doing well on Dilantin  but does worry about side effects of long-term use.  We discussed possible of gum hyperplasia, or cerebellar atrophy causing balance problem.  At the moment,  he will continue with Dilantin   INTERVAL HISTORY 09/21/2024 No seizures, remains on Dilantin  300 mg daily. Tolerating well. Takes calcium . Had labs done recently at PCP, I will request his labs. Lives with his finance. He is working part time as an personnel officer.  Lost 10-15 lbs since last seen, has been walking.  Labs from PCP 08/04/23 TSH 2.120 normal CBC alk phos 129, creatinine 1.63  09/16/22  SS: He had an abnormal EEG in December 2022 showing frequent right temporal sharps consistent with an area epileptogenic potential and right temporal slowing.  Given abnormal EEG we would not recommend coming off seizure medication.  He has remained on Dilantin  300 mg daily in the AM, denies side effects. No seizures. He is now on rosuvastatin.  Has labs done with PCP in October Dilantin  level was 12.2.  Update 09/16/21 SS: Eric Craig is a 63 year old male with history of seizures. 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  His last seizure was in 2001.  After last visit, he was switched from brand-name Dilantin  to generic.  He has done well with this.  He is a sport and exercise psychologist.  He drives a car.  Had blood work done at PCP, reports was unremarkable, Dilantin  level was reported around 8.  He mentions the idea of decreasing or completely coming off the Dilantin  since it has been so long since his last seizure.  After probing, reports the Dilantin  has resulted in some sexual side effects.  Claims his last seizures occurred when he was heavily drinking alcohol, he now rarely drinks.  Here today for evaluation unaccompanied.  HISTORY  09/19/2020 Dr. Jenel: Eric Craig is a 63 year old left-handed white male with a history of seizures. 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 The patient has done well with his seizures, the last seizure was in 2001. The patient remains on  brand name Dilantin . He recently had a blood level done through his primary care physician of 13.6. The patient tolerates the drug well. He wishes to switch to a generic medication to save money. He returns to this office for an evaluation. No new medical issues have come up since last seen, the patient has had cataract surgery.  REVIEW OF SYSTEMS: Out of a complete  14 system review of symptoms, the patient complains only of the following symptoms, and all other reviewed systems are negative.  See HPI  ALLERGIES: No Known Allergies  HOME MEDICATIONS: Outpatient Medications Prior to Visit  Medication Sig Dispense Refill   amLODipine  (NORVASC ) 10 MG tablet Take 10 mg by mouth daily.     calcium  carbonate (OS-CAL) 600 MG TABS Take 600 mg by mouth daily.     losartan -hydrochlorothiazide (HYZAAR) 100-25 MG tablet SMARTSIG:1.0 Tablet(s) By Mouth Daily     Multiple Vitamin (MULTIVITAMIN) capsule Take 1 capsule by mouth daily. am     olmesartan-hydrochlorothiazide (BENICAR HCT) 40-25 MG tablet Take 1 tablet by mouth daily.     phenytoin  (DILANTIN ) 100 MG ER capsule TAKE 3 CAPSULES BY MOUTH DAILY 180 capsule 5   rosuvastatin (CRESTOR) 10 MG tablet Take 10 mg by mouth at bedtime.     hydrocortisone  (ANUSOL -HC) 25 MG suppository Place 1 suppository (25 mg total) rectally daily. Anusol  suppository nightly X 1 week and then every other night until prescription is done 12 suppository 1   No facility-administered medications prior to visit.    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 C6-7 repair in 1993 and 2001. Residual left arm weakness   removal of the arteriovenous malformation  1997   [place]   right temporal craniotomy  2/97   basilarr artery aneurysm    FAMILY HISTORY: Family History  Problem Relation Age of Onset   Aortic aneurysm Mother 47   Hypertension Father    Cancer Brother    Prostate cancer Brother    Heart disease Other    Colon cancer Neg Hx    Colon polyps Neg Hx    Esophageal cancer Neg Hx    Rectal cancer Neg Hx    Stomach cancer Neg Hx    Inflammatory bowel disease Neg Hx  Liver disease Neg Hx    Pancreatic cancer Neg Hx     SOCIAL HISTORY: Social History   Socioeconomic History   Marital status: Single    Spouse name: Not on file   Number of children: 2   Years of education: 12   Highest education level: Not on file  Occupational History   Occupation: Architect: ADVICE WORKER    Comment: retired  Tobacco Use   Smoking status: Former    Current packs/day: 1.00    Average packs/day: 1 pack/day for 40.0 years (40.0 ttl pk-yrs)    Types: Cigarettes   Smokeless tobacco: Never   Tobacco comments:    Quit three years ago.  Substance and Sexual Activity   Alcohol use: Yes    Alcohol/week: 0.0 standard drinks of alcohol    Comment: Drinks 12 beers per year   Drug use: No   Sexual activity: Not on file  Other Topics Concern   Not on file  Social History Narrative   Lives alone   Patient is left handed   Caffeine consumption is 4 cups daily   Social Drivers of Health   Tobacco Use: Medium Risk (09/21/2024)   Patient History    Smoking Tobacco Use: Former    Smokeless Tobacco Use: Never    Passive Exposure: Not on Actuary Strain: Not on file  Food Insecurity: Not on file  Transportation Needs: Not  on file  Physical Activity: Not on file  Stress: Not on file  Social Connections: Not on file  Intimate Partner Violence: Not on file  Depression (EYV7-0): Not on file  Alcohol Screen: Not on file  Housing: Not on file  Utilities: Not on file  Health Literacy: Not on file    PHYSICAL EXAM  Vitals:   09/21/24 1113  BP: (!) 157/84  Pulse: 83  Weight: 202 lb (91.6 kg)  Height: 5' 9 (1.753 m)   Body mass index is 29.83 kg/m.  Generalized: Well developed, in no acute distress  Neurological examination  Mentation: Alert oriented to time, place, history taking. Follows all commands speech and language fluent Cranial nerve II-XII: Pupils were equal round reactive to light. Extraocular movements were full, right homonymous visual field deficit.  Facial sensation and strength were normal. Head turning and shoulder shrug  were normal and symmetric. Motor: The motor testing reveals 5 over 5 strength of all 4 extremities. Good symmetric motor tone is noted throughout.  Sensory: Sensory testing is intact to soft touch on all 4 extremities. No evidence of extinction is noted.  Coordination: Cerebellar testing reveals good finger-nose-finger and heel-to-shin bilaterally.  Some trouble with finger-nose-finger bilaterally, slightly tremulous. Gait and station: Gait is normal.  Reflexes: Deep tendon reflexes are symmetric and normal bilaterally.   DIAGNOSTIC DATA (LABS, IMAGING, TESTING) - I reviewed patient records, labs, notes, testing and imaging myself where available.  Lab Results  Component Value Date   WBC 3.8 05/29/2019   HGB 14.8 05/29/2019   HCT 42.9 05/29/2019   MCV 98 (H) 05/29/2019   PLT 185 05/29/2019      Component Value Date/Time   NA 142 05/29/2019 0830   K 4.9 05/29/2019 0830   CL 103 05/29/2019 0830   CO2 24 05/29/2019 0830   GLUCOSE 96 05/29/2019 0830   GLUCOSE 96 12/17/2014 1329   BUN 27 05/29/2019 0830   CREATININE 1.49 (H) 05/29/2019 0830   CALCIUM  9.9  05/29/2019 0830   PROT 7.5 05/29/2019  0830   ALBUMIN 4.7 05/29/2019 0830   AST 14 05/29/2019 0830   ALT 17 05/29/2019 0830   ALKPHOS 113 05/29/2019 0830   BILITOT 0.2 05/29/2019 0830   GFRNONAA 50 (L) 05/29/2019 0830   GFRAA 58 (L) 05/29/2019 0830   Lab Results  Component Value Date   CHOL 184 12/18/2014   HDL 56 12/18/2014   LDLCALC 108 (H) 12/18/2014   TRIG 98 12/18/2014   CHOLHDL 3.3 12/18/2014   Lab Results  Component Value Date   HGBA1C 5.4 12/18/2014   No results found for: VITAMINB12 No results found for: TSH  Pastor Falling, MD  09/21/2024, 11:42 AM [facility] [address], [address] [place], [place] [address] [phone]

## 2024-09-21 NOTE — Patient Instructions (Signed)
 Continue with Dilantin  300 mg daily Continue follow-up with your PCP Return in 1 year or sooner if worse
